# Patient Record
Sex: Female | Born: 1950 | Race: White | Hispanic: No | Smoking: Former smoker
Health system: Southern US, Community
[De-identification: ages and names within clinical notes are randomized; demographics above are authoritative.]

## PROBLEM LIST (undated history)

## (undated) DIAGNOSIS — H269 Unspecified cataract: Secondary | ICD-10-CM

## (undated) DIAGNOSIS — F329 Major depressive disorder, single episode, unspecified: Secondary | ICD-10-CM

## (undated) DIAGNOSIS — Z789 Other specified health status: Secondary | ICD-10-CM

## (undated) DIAGNOSIS — I719 Aortic aneurysm of unspecified site, without rupture: Secondary | ICD-10-CM

## (undated) DIAGNOSIS — F909 Attention-deficit hyperactivity disorder, unspecified type: Secondary | ICD-10-CM

## (undated) DIAGNOSIS — F32A Depression, unspecified: Secondary | ICD-10-CM

## (undated) DIAGNOSIS — K219 Gastro-esophageal reflux disease without esophagitis: Secondary | ICD-10-CM

## (undated) DIAGNOSIS — K589 Irritable bowel syndrome without diarrhea: Secondary | ICD-10-CM

## (undated) HISTORY — DX: Depression, unspecified: F32.A

## (undated) HISTORY — DX: Major depressive disorder, single episode, unspecified: F32.9

## (undated) HISTORY — PX: BREAST EXCISIONAL BIOPSY: SUR124

## (undated) HISTORY — DX: Attention-deficit hyperactivity disorder, unspecified type: F90.9

## (undated) HISTORY — DX: Irritable bowel syndrome, unspecified: K58.9

## (undated) HISTORY — DX: Unspecified cataract: H26.9

## (undated) HISTORY — PX: BREAST SURGERY: SHX581

## (undated) HISTORY — PX: EYE SURGERY: SHX253

---
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
## 1983-04-28 HISTORY — PX: LAPAROSCOPY: SHX197

## 1995-06-28 HISTORY — PX: KNEE ARTHROSCOPY: SUR90

## 1998-03-16 ENCOUNTER — Other Ambulatory Visit: Admission: RE | Admit: 1998-03-16 | Discharge: 1998-03-16 | Payer: Self-pay | Admitting: Obstetrics and Gynecology

## 1998-12-29 ENCOUNTER — Inpatient Hospital Stay (HOSPITAL_COMMUNITY): Admission: AD | Admit: 1998-12-29 | Discharge: 1998-12-29 | Payer: Self-pay | Admitting: Obstetrics and Gynecology

## 2000-06-17 ENCOUNTER — Other Ambulatory Visit: Admission: RE | Admit: 2000-06-17 | Discharge: 2000-06-17 | Payer: Self-pay | Admitting: *Deleted

## 2001-01-27 HISTORY — PX: BREAST LUMPECTOMY: SHX2

## 2001-12-30 ENCOUNTER — Other Ambulatory Visit: Admission: RE | Admit: 2001-12-30 | Discharge: 2001-12-30 | Payer: Self-pay | Admitting: Obstetrics & Gynecology

## 2003-03-10 ENCOUNTER — Other Ambulatory Visit: Admission: RE | Admit: 2003-03-10 | Discharge: 2003-03-10 | Payer: Self-pay | Admitting: Obstetrics & Gynecology

## 2006-03-05 ENCOUNTER — Other Ambulatory Visit: Admission: RE | Admit: 2006-03-05 | Discharge: 2006-03-05 | Payer: Self-pay | Admitting: Internal Medicine

## 2006-03-24 ENCOUNTER — Encounter: Admission: RE | Admit: 2006-03-24 | Discharge: 2006-03-24 | Payer: Self-pay | Admitting: Internal Medicine

## 2008-02-08 ENCOUNTER — Ambulatory Visit: Payer: Self-pay | Admitting: Gastroenterology

## 2008-02-22 ENCOUNTER — Ambulatory Visit: Payer: Self-pay | Admitting: Gastroenterology

## 2008-04-04 ENCOUNTER — Ambulatory Visit: Payer: Self-pay | Admitting: Gastroenterology

## 2008-05-16 ENCOUNTER — Ambulatory Visit: Payer: Self-pay | Admitting: Gastroenterology

## 2008-05-30 ENCOUNTER — Ambulatory Visit: Payer: Self-pay | Admitting: Gastroenterology

## 2008-06-13 ENCOUNTER — Ambulatory Visit: Payer: Self-pay | Admitting: Gastroenterology

## 2008-09-05 ENCOUNTER — Ambulatory Visit: Payer: Self-pay | Admitting: Gastroenterology

## 2008-12-08 ENCOUNTER — Ambulatory Visit: Payer: Self-pay | Admitting: Gastroenterology

## 2009-02-28 ENCOUNTER — Ambulatory Visit: Payer: Self-pay | Admitting: Gastroenterology

## 2009-05-29 ENCOUNTER — Ambulatory Visit: Payer: Self-pay | Admitting: Gastroenterology

## 2009-08-28 ENCOUNTER — Ambulatory Visit: Payer: Self-pay | Admitting: Gastroenterology

## 2009-12-04 ENCOUNTER — Ambulatory Visit: Payer: Self-pay | Admitting: Gastroenterology

## 2011-01-28 HISTORY — PX: BREAST LUMPECTOMY: SHX2

## 2011-12-02 ENCOUNTER — Ambulatory Visit (INDEPENDENT_AMBULATORY_CARE_PROVIDER_SITE_OTHER): Payer: No Typology Code available for payment source | Admitting: Surgery

## 2011-12-02 ENCOUNTER — Encounter (INDEPENDENT_AMBULATORY_CARE_PROVIDER_SITE_OTHER): Payer: Self-pay | Admitting: Surgery

## 2011-12-02 VITALS — BP 124/86 | HR 76 | Temp 97.9°F | Resp 16 | Ht 67.5 in | Wt 129.6 lb

## 2011-12-02 DIAGNOSIS — N63 Unspecified lump in unspecified breast: Secondary | ICD-10-CM

## 2011-12-02 NOTE — Progress Notes (Signed)
Patient ID: Deanna Velasquez, female   DOB: [birth year], 61 y.o.   MRN: [id number]  Chief Complaint  Patient presents with  . Breast Problem    new pt- eval Right breast mass; no biopsy    HPI Deanna Velasquez is a 61 y.o. female.  A recent mammogram showed an abnormality in the right breast and is is moderately suspicious and unable to be Bx with a NCB technique. Surgical consultation was requested. She has no sx. Her mother had lobular breast cancer HPI  History reviewed. No pertinent past medical history.  Past Surgical History  Procedure Date  . Knee arthroscopy 06/1995    Dr. Fredric Mare  . Pilonidal cyst excision 1969    Dr. Rolene Course  . Tonsilectomy/adenoidectomy with myringotomy 12/1953  . Laparoscopy 04/1983    Family History  Problem Relation Age of Onset  . Cancer Mother     breast  . Stroke Father   . Cancer Maternal Aunt     breast    Social History History  Substance Use Topics  . Smoking status: Former Games developer  . Smokeless tobacco: Not on file  . Alcohol Use: Yes    No Known Allergies  Current Outpatient Prescriptions  Medication Sig Dispense Refill  . buPROPion (WELLBUTRIN XL) 150 MG 24 hr tablet Take 150 mg by mouth daily.        Review of Systems Review of Systems  Constitutional: Negative for fever, chills and unexpected weight change.  HENT: Negative for hearing loss, congestion, sore throat, trouble swallowing and voice change.   Eyes: Negative for visual disturbance.  Respiratory: Negative for cough and wheezing.   Cardiovascular: Negative for chest pain, palpitations and leg swelling.  Gastrointestinal: Negative for nausea, vomiting, abdominal pain, diarrhea, constipation, blood in stool, abdominal distention and anal bleeding.  Genitourinary: Negative for hematuria, vaginal bleeding and difficulty urinating.  Musculoskeletal: Negative for arthralgias.  Skin: Negative for rash and wound.  Neurological: Negative for seizures, syncope and headaches.    Hematological: Negative for adenopathy. Does not bruise/bleed easily.  Psychiatric/Behavioral: Negative for confusion.    Blood pressure 124/86, pulse 76, temperature 97.9 F (36.6 C), temperature source Temporal, resp. rate 16, height 5' 7.5" (1.715 m), weight 129 lb 9.6 oz (58.786 kg).  Physical Exam Physical Exam  Vitals reviewed. Constitutional: She is oriented to person, place, and time. She appears well-developed and well-nourished. No distress.  HENT:  Head: Normocephalic and atraumatic.  Mouth/Throat: Oropharynx is clear and moist.  Eyes: Conjunctivae normal and EOM are normal. Pupils are equal, round, and reactive to light. No scleral icterus.  Neck: Normal range of motion. Neck supple. No tracheal deviation present. No thyromegaly present.  Cardiovascular: Normal rate, regular rhythm, normal heart sounds and intact distal pulses.  Exam reveals no gallop and no friction rub.   No murmur heard. Pulmonary/Chest: Effort normal and breath sounds normal. No respiratory distress. She has no wheezes. She has no rales.       Breasts are symmetric bilaterally. There is no palpable mass or other abnormality.  Abdominal: Soft. Bowel sounds are normal. She exhibits no distension and no mass. There is no tenderness. There is no rebound and no guarding.  Musculoskeletal: Normal range of motion. She exhibits no edema and no tenderness.  Lymphadenopathy:    She has no cervical adenopathy.    She has no axillary adenopathy.       Right: No supraclavicular adenopathy present.       Left: No supraclavicular adenopathy present.  Neurological:  She is alert and oriented to person, place, and time.  Skin: Skin is warm and dry. No rash noted. She is not diaphoretic. No erythema.  Psychiatric: She has a normal mood and affect. Her behavior is normal. Judgment and thought content normal.    Data Reviewed Mammogram and ultrasound films and reports are reviewed. There is a suspicious area in the  right breast. Is not palpable.  Assessment    Nonpalpable suspicious right breast mass    Plan    Wire localized excisional biopsy. I have discussed the indications for the lumpectomy and described the procedure. She understand that the chance of removal of the abnormal area is very good, but that occasionally we are unable to locate it and may have to do a second procedure. We also discussed the possibility of a second procedure to get additional tissue. Risks of surgery such as bleeding and infection have also been explained, as well as the implications of not doing the surgery. She understands and wishes to proceed.        Shalini Mair J 12/02/2011, 2:45 PM

## 2011-12-02 NOTE — Patient Instructions (Signed)
We will schedule surgery to remove the abnormal area in the right breast

## 2011-12-03 ENCOUNTER — Encounter (INDEPENDENT_AMBULATORY_CARE_PROVIDER_SITE_OTHER): Payer: Self-pay

## 2011-12-22 ENCOUNTER — Encounter (HOSPITAL_BASED_OUTPATIENT_CLINIC_OR_DEPARTMENT_OTHER): Payer: Self-pay | Admitting: *Deleted

## 2011-12-22 NOTE — Progress Notes (Signed)
No labs needed

## 2011-12-29 ENCOUNTER — Encounter (HOSPITAL_BASED_OUTPATIENT_CLINIC_OR_DEPARTMENT_OTHER): Payer: Self-pay

## 2011-12-29 ENCOUNTER — Encounter (HOSPITAL_BASED_OUTPATIENT_CLINIC_OR_DEPARTMENT_OTHER): Payer: Self-pay | Admitting: Certified Registered Nurse Anesthetist

## 2011-12-29 ENCOUNTER — Encounter (HOSPITAL_BASED_OUTPATIENT_CLINIC_OR_DEPARTMENT_OTHER)
Admission: RE | Disposition: A | Discharge status: Home / Self Care | Payer: Self-pay | Source: Ambulatory Visit | Attending: Surgery

## 2011-12-29 ENCOUNTER — Ambulatory Visit (HOSPITAL_BASED_OUTPATIENT_CLINIC_OR_DEPARTMENT_OTHER): Payer: No Typology Code available for payment source | Admitting: Certified Registered Nurse Anesthetist

## 2011-12-29 ENCOUNTER — Ambulatory Visit (HOSPITAL_BASED_OUTPATIENT_CLINIC_OR_DEPARTMENT_OTHER)
Admission: RE | Admit: 2011-12-29 | Discharge: 2011-12-29 | Disposition: A | Discharge status: Home / Self Care | Payer: No Typology Code available for payment source | Source: Ambulatory Visit | Attending: Surgery | Admitting: Surgery

## 2011-12-29 DIAGNOSIS — K219 Gastro-esophageal reflux disease without esophagitis: Secondary | ICD-10-CM | POA: Insufficient documentation

## 2011-12-29 DIAGNOSIS — Z87891 Personal history of nicotine dependence: Secondary | ICD-10-CM | POA: Insufficient documentation

## 2011-12-29 DIAGNOSIS — N6019 Diffuse cystic mastopathy of unspecified breast: Secondary | ICD-10-CM | POA: Insufficient documentation

## 2011-12-29 DIAGNOSIS — Z823 Family history of stroke: Secondary | ICD-10-CM | POA: Insufficient documentation

## 2011-12-29 DIAGNOSIS — Z803 Family history of malignant neoplasm of breast: Secondary | ICD-10-CM | POA: Insufficient documentation

## 2011-12-29 DIAGNOSIS — N63 Unspecified lump in unspecified breast: Secondary | ICD-10-CM

## 2011-12-29 HISTORY — DX: Gastro-esophageal reflux disease without esophagitis: K21.9

## 2011-12-29 HISTORY — DX: Other specified health status: Z78.9

## 2011-12-29 HISTORY — PX: BREAST LUMPECTOMY WITH NEEDLE LOCALIZATION: SHX5759

## 2011-12-29 LAB — POCT HEMOGLOBIN-HEMACUE: Hemoglobin: 15.2 g/dL — ABNORMAL HIGH (ref 12.0–15.0)

## 2011-12-29 SURGERY — BREAST LUMPECTOMY WITH NEEDLE LOCALIZATION
Anesthesia: Monitor Anesthesia Care | Site: Breast | Laterality: Right | Wound class: Clean

## 2011-12-29 MED ORDER — BUPIVACAINE-EPINEPHRINE 0.5% -1:200000 IJ SOLN
INTRAMUSCULAR | Status: DC | PRN
  Administered 2011-12-29: 14 mL

## 2011-12-29 MED ORDER — MIDAZOLAM HCL 2 MG/2ML IJ SOLN: 1.0000 mg | INTRAMUSCULAR | Status: DC | PRN

## 2011-12-29 MED ORDER — FENTANYL CITRATE 0.05 MG/ML IJ SOLN: 50.0000 ug | INTRAMUSCULAR | Status: DC | PRN

## 2011-12-29 MED ORDER — FENTANYL CITRATE 0.05 MG/ML IJ SOLN
INTRAMUSCULAR | Status: DC | PRN
  Administered 2011-12-29: 25 ug via INTRAVENOUS

## 2011-12-29 MED ORDER — CEFAZOLIN SODIUM-DEXTROSE 2-3 GM-% IV SOLR
2.0000 g | INTRAVENOUS | Status: AC
  Administered 2011-12-29: 2 g via INTRAVENOUS

## 2011-12-29 MED ORDER — PROPOFOL 10 MG/ML IV EMUL
INTRAVENOUS | Status: DC | PRN
  Administered 2011-12-29: 50 ug/kg/min via INTRAVENOUS

## 2011-12-29 MED ORDER — LIDOCAINE HCL (PF) 1 % IJ SOLN
INTRAMUSCULAR | Status: DC | PRN
  Administered 2011-12-29: 14 mL

## 2011-12-29 MED ORDER — LACTATED RINGERS IV SOLN
INTRAVENOUS | Status: DC
  Administered 2011-12-29: 10:00:00 via INTRAVENOUS

## 2011-12-29 MED ORDER — HYDROCODONE-ACETAMINOPHEN 5-325 MG PO TABS: 1.0000 | ORAL_TABLET | ORAL | Status: DC | PRN

## 2011-12-29 MED ORDER — CHLORHEXIDINE GLUCONATE 4 % EX LIQD: 1.0000 "application " | Freq: Once | CUTANEOUS | Status: DC

## 2011-12-29 MED ORDER — ONDANSETRON HCL 4 MG/2ML IJ SOLN
INTRAMUSCULAR | Status: DC | PRN
  Administered 2011-12-29: 4 mg via INTRAVENOUS

## 2011-12-29 SURGICAL SUPPLY — 47 items
ADH SKN CLS APL DERMABOND .7 (GAUZE/BANDAGES/DRESSINGS) ×1
APPLICATOR COTTON TIP 6IN STRL (MISCELLANEOUS) IMPLANT
BINDER BREAST LRG (GAUZE/BANDAGES/DRESSINGS) IMPLANT
BINDER BREAST MEDIUM (GAUZE/BANDAGES/DRESSINGS) IMPLANT
BINDER BREAST XLRG (GAUZE/BANDAGES/DRESSINGS) IMPLANT
BINDER BREAST XXLRG (GAUZE/BANDAGES/DRESSINGS) IMPLANT
BLADE HEX COATED 2.75 (ELECTRODE) ×2 IMPLANT
BLADE SURG 15 STRL LF DISP TIS (BLADE) ×1 IMPLANT
BLADE SURG 15 STRL SS (BLADE) ×2
CANISTER SUCTION 1200CC (MISCELLANEOUS) ×2 IMPLANT
CHLORAPREP W/TINT 26ML (MISCELLANEOUS) ×2 IMPLANT
CLIP TI MEDIUM 6 (CLIP) IMPLANT
CLIP TI WIDE RED SMALL 6 (CLIP) IMPLANT
CLOTH BEACON ORANGE TIMEOUT ST (SAFETY) ×2 IMPLANT
COVER MAYO STAND STRL (DRAPES) ×2 IMPLANT
COVER TABLE BACK 60X90 (DRAPES) ×2 IMPLANT
DECANTER SPIKE VIAL GLASS SM (MISCELLANEOUS) IMPLANT
DERMABOND ADVANCED (GAUZE/BANDAGES/DRESSINGS) ×1
DERMABOND ADVANCED .7 DNX12 (GAUZE/BANDAGES/DRESSINGS) ×1 IMPLANT
DEVICE DUBIN W/COMP PLATE 8390 (MISCELLANEOUS) IMPLANT
DRAPE LAPAROTOMY TRNSV 102X78 (DRAPE) ×2 IMPLANT
DRAPE SURG 17X23 STRL (DRAPES) IMPLANT
DRAPE UTILITY XL STRL (DRAPES) ×2 IMPLANT
ELECT REM PT RETURN 9FT ADLT (ELECTROSURGICAL) ×2
ELECTRODE REM PT RTRN 9FT ADLT (ELECTROSURGICAL) ×1 IMPLANT
GLOVE EUDERMIC 7 POWDERFREE (GLOVE) ×2 IMPLANT
GOWN PREVENTION PLUS XLARGE (GOWN DISPOSABLE) ×4 IMPLANT
KIT MARKER MARGIN INK (KITS) IMPLANT
NDL HYPO 25X1 1.5 SAFETY (NEEDLE) ×1 IMPLANT
NEEDLE HYPO 25X1 1.5 SAFETY (NEEDLE) ×2 IMPLANT
NS IRRIG 1000ML POUR BTL (IV SOLUTION) IMPLANT
PACK BASIN DAY SURGERY FS (CUSTOM PROCEDURE TRAY) ×2 IMPLANT
PENCIL BUTTON HOLSTER BLD 10FT (ELECTRODE) ×2 IMPLANT
SHEET MEDIUM DRAPE 40X70 STRL (DRAPES) ×2 IMPLANT
SLEEVE SCD COMPRESS KNEE MED (MISCELLANEOUS) ×2 IMPLANT
SPONGE GAUZE 4X4 12PLY (GAUZE/BANDAGES/DRESSINGS) IMPLANT
SPONGE INTESTINAL PEANUT (DISPOSABLE) IMPLANT
SPONGE LAP 4X18 X RAY DECT (DISPOSABLE) ×2 IMPLANT
STAPLER VISISTAT 35W (STAPLE) IMPLANT
SUT MNCRL AB 4-0 PS2 18 (SUTURE) ×2 IMPLANT
SUT SILK 0 TIES 10X30 (SUTURE) IMPLANT
SUT VICRYL 3-0 CR8 SH (SUTURE) ×2 IMPLANT
SYR CONTROL 10ML LL (SYRINGE) ×2 IMPLANT
TOWEL OR NON WOVEN STRL DISP B (DISPOSABLE) ×2 IMPLANT
TUBE CONNECTING 20X1/4 (TUBING) ×2 IMPLANT
WATER STERILE IRR 1000ML POUR (IV SOLUTION) ×2 IMPLANT
YANKAUER SUCT BULB TIP NO VENT (SUCTIONS) ×2 IMPLANT

## 2011-12-29 NOTE — Transfer of Care (Signed)
Immediate Anesthesia Transfer of Care Note  Patient: Deanna Velasquez  Procedure(s) Performed: Procedure(s) (LRB) with comments: BREAST LUMPECTOMY WITH NEEDLE LOCALIZATION (Right) - Needle Localized Removal Right Breast Mass  Patient Location: PACU  Anesthesia Type:MAC  Level of Consciousness: awake, alert , oriented and patient cooperative  Airway & Oxygen Therapy: Patient Spontanous Breathing  Post-op Assessment: Report given to PACU RN and Post -op Vital signs reviewed and stable  Post vital signs: Reviewed and stable  Complications: No apparent anesthesia complications

## 2011-12-29 NOTE — Op Note (Signed)
Deanna Velasquez  08-09-1950  [id number]  12/29/2011   Preoperative diagnosis: Right breast mass (area of architectural distortion  Postoperative diagnosis: Same  Procedure: Wire localized right breast excision of mass  Surgeon: Currie Paris, MD, FACS  Anesthesia: MAC  Clinical History and Indications: this patient presents for a guidewire localized excision of a right breast mass/ area of distortion.  Description of procedure: The patient was seen in the holding area and the plans for the procedure reviewed. The right breast was marked as the operative side. The wire localizing films were reviewed.  The patient was taken to the operating room and after satisfactory general anesthesia had been obtained the right breast was prepped and draped and the timeout was performed.  The incision was made over the presumed area of the mass. The guidewire entered high in the UOQ and tracked inferor and deep. The mass appeared to be anterior to the wire.Skin flaps were raised and using cautery the area was completely excised. I went to chest wall and took tissue anterior up to sub Q. The breast was very dense. Although the radiologist thought the specimen mammogram contained the area, I took some additional tissue to be sure I was well around the area. In doing so there was a cystic area that was encountered and removed with the extra tissue.   Bleeders were controlled with either cautery or sutures as needed.  After achieving hemostasis, the incision was closed with 3-0 Vicryl, 4-0 Monocryl subcuticular, and Dermabond.  The patient tolerated the procedure well. There were no operative complications. All counts were correct.   EBL: Minimal  Currie Paris, MD, FACS 12/29/2011 11:24 AM

## 2011-12-29 NOTE — Anesthesia Procedure Notes (Signed)
Procedure Name: MAC Date/Time: 12/29/2011 10:44 AM Performed by: Leonie Amacher D Pre-anesthesia Checklist: Patient identified, Emergency Drugs available, Suction available, Patient being monitored and Timeout performed Patient Re-evaluated:Patient Re-evaluated prior to inductionOxygen Delivery Method: Simple face mask

## 2011-12-29 NOTE — Anesthesia Preprocedure Evaluation (Signed)
Anesthesia Evaluation  Patient identified by MRN, date of birth, ID band Patient awake    Reviewed: Allergy & Precautions, H&P , NPO status , Patient's Chart, lab work & pertinent test results  History of Anesthesia Complications Negative for: history of anesthetic complications  Airway Mallampati: I TM Distance: >3 FB Neck ROM: Full    Dental No notable dental hx. (+) Caps and Dental Advisory Given   Pulmonary former smoker (quit 1980's),  breath sounds clear to auscultation  Pulmonary exam normal       Cardiovascular negative cardio ROS  Rhythm:Regular Rate:Normal     Neuro/Psych negative neurological ROS     GI/Hepatic Neg liver ROS, GERD-  Medicated and Controlled,  Endo/Other  negative endocrine ROS  Renal/GU negative Renal ROS     Musculoskeletal   Abdominal   Peds  Hematology negative hematology ROS (+)   Anesthesia Other Findings   Reproductive/Obstetrics                           Anesthesia Physical Anesthesia Plan  ASA: II  Anesthesia Plan: MAC   Post-op Pain Management:    Induction:   Airway Management Planned: Simple Face Mask and Natural Airway  Additional Equipment:   Intra-op Plan:   Post-operative Plan:   Informed Consent: I have reviewed the patients History and Physical, chart, labs and discussed the procedure including the risks, benefits and alternatives for the proposed anesthesia with the patient or authorized representative who has indicated his/her understanding and acceptance.   Dental advisory given  Plan Discussed with: CRNA and Surgeon  Anesthesia Plan Comments: (Plan routine monitors, MAC )        Anesthesia Quick Evaluation

## 2011-12-29 NOTE — Anesthesia Postprocedure Evaluation (Signed)
  Anesthesia Post-op Note  Patient: Deanna Velasquez  Procedure(s) Performed: Procedure(s) (LRB) with comments: BREAST LUMPECTOMY WITH NEEDLE LOCALIZATION (Right) - Needle Localized Removal Right Breast Mass  Patient Location: PACU  Anesthesia Type:MAC  Level of Consciousness: awake, alert , oriented and patient cooperative  Airway and Oxygen Therapy: Patient Spontanous Breathing  Post-op Pain: none  Post-op Assessment: Post-op Vital signs reviewed, Patient's Cardiovascular Status Stable, Respiratory Function Stable, Patent Airway, No signs of Nausea or vomiting and Pain level controlled  Post-op Vital Signs: Reviewed and stable  Complications: No apparent anesthesia complications

## 2011-12-29 NOTE — Interval H&P Note (Signed)
History and Physical Interval Note:  12/29/2011 10:22 AM  Deanna Velasquez  has presented today for surgery, with the diagnosis of right breast mass  The various methods of treatment have been discussed with the patient and family. After consideration of risks, benefits and other options for treatment, the patient has consented to  Procedure(s) (LRB) with comments: BREAST LUMPECTOMY WITH NEEDLE LOCALIZATION (Right) - Needle Localized Removal Right Breast Mass as a surgical intervention .  The patient's history has been reviewed, patient examined, no change in status, stable for surgery.  I have reviewed the patient's chart and labs.  Questions were answered to the patient's satisfaction.   I have reviewed the localization films and marked the right breast as the operative site Japji Kok J

## 2011-12-29 NOTE — H&P (View-Only) (Signed)
Patient ID: Deanna Velasquez, female   DOB: 11/07/1950, 61 y.o.   MRN: [id number]  Chief Complaint  Patient presents with  . Breast Problem    new pt- eval Right breast mass; no biopsy    HPI Deanna Velasquez is a 61 y.o. female.  A recent mammogram showed an abnormality in the right breast and is is moderately suspicious and unable to be Bx with a NCB technique. Surgical consultation was requested. She has no sx. Her mother had lobular breast cancer HPI  History reviewed. No pertinent past medical history.  Past Surgical History  Procedure Date  . Knee arthroscopy 06/1995    Dr. Wiener  . Pilonidal cyst excision 1969    Dr. Farley  . Tonsilectomy/adenoidectomy with myringotomy 12/1953  . Laparoscopy 04/1983    Family History  Problem Relation Age of Onset  . Cancer Mother     breast  . Stroke Father   . Cancer Maternal Aunt     breast    Social History History  Substance Use Topics  . Smoking status: Former Smoker  . Smokeless tobacco: Not on file  . Alcohol Use: Yes    No Known Allergies  Current Outpatient Prescriptions  Medication Sig Dispense Refill  . buPROPion (WELLBUTRIN XL) 150 MG 24 hr tablet Take 150 mg by mouth daily.        Review of Systems Review of Systems  Constitutional: Negative for fever, chills and unexpected weight change.  HENT: Negative for hearing loss, congestion, sore throat, trouble swallowing and voice change.   Eyes: Negative for visual disturbance.  Respiratory: Negative for cough and wheezing.   Cardiovascular: Negative for chest pain, palpitations and leg swelling.  Gastrointestinal: Negative for nausea, vomiting, abdominal pain, diarrhea, constipation, blood in stool, abdominal distention and anal bleeding.  Genitourinary: Negative for hematuria, vaginal bleeding and difficulty urinating.  Musculoskeletal: Negative for arthralgias.  Skin: Negative for rash and wound.  Neurological: Negative for seizures, syncope and headaches.    Hematological: Negative for adenopathy. Does not bruise/bleed easily.  Psychiatric/Behavioral: Negative for confusion.    Blood pressure 124/86, pulse 76, temperature 97.9 F (36.6 C), temperature source Temporal, resp. rate 16, height 5' 7.5" (1.715 m), weight 129 lb 9.6 oz (58.786 kg).  Physical Exam Physical Exam  Vitals reviewed. Constitutional: She is oriented to person, place, and time. She appears well-developed and well-nourished. No distress.  HENT:  Head: Normocephalic and atraumatic.  Mouth/Throat: Oropharynx is clear and moist.  Eyes: Conjunctivae normal and EOM are normal. Pupils are equal, round, and reactive to light. No scleral icterus.  Neck: Normal range of motion. Neck supple. No tracheal deviation present. No thyromegaly present.  Cardiovascular: Normal rate, regular rhythm, normal heart sounds and intact distal pulses.  Exam reveals no gallop and no friction rub.   No murmur heard. Pulmonary/Chest: Effort normal and breath sounds normal. No respiratory distress. She has no wheezes. She has no rales.       Breasts are symmetric bilaterally. There is no palpable mass or other abnormality.  Abdominal: Soft. Bowel sounds are normal. She exhibits no distension and no mass. There is no tenderness. There is no rebound and no guarding.  Musculoskeletal: Normal range of motion. She exhibits no edema and no tenderness.  Lymphadenopathy:    She has no cervical adenopathy.    She has no axillary adenopathy.       Right: No supraclavicular adenopathy present.       Left: No supraclavicular adenopathy present.  Neurological:   She is alert and oriented to person, place, and time.  Skin: Skin is warm and dry. No rash noted. She is not diaphoretic. No erythema.  Psychiatric: She has a normal mood and affect. Her behavior is normal. Judgment and thought content normal.    Data Reviewed Mammogram and ultrasound films and reports are reviewed. There is a suspicious area in the  right breast. Is not palpable.  Assessment    Nonpalpable suspicious right breast mass    Plan    Wire localized excisional biopsy. I have discussed the indications for the lumpectomy and described the procedure. She understand that the chance of removal of the abnormal area is very good, but that occasionally we are unable to locate it and may have to do a second procedure. We also discussed the possibility of a second procedure to get additional tissue. Risks of surgery such as bleeding and infection have also been explained, as well as the implications of not doing the surgery. She understands and wishes to proceed.        Deanna Velasquez 12/02/2011, 2:45 PM    

## 2011-12-30 ENCOUNTER — Encounter (HOSPITAL_BASED_OUTPATIENT_CLINIC_OR_DEPARTMENT_OTHER): Payer: Self-pay | Admitting: Surgery

## 2011-12-31 ENCOUNTER — Telehealth (INDEPENDENT_AMBULATORY_CARE_PROVIDER_SITE_OTHER): Payer: Self-pay | Admitting: General Surgery

## 2011-12-31 NOTE — Telephone Encounter (Signed)
Message copied by Liliana Cline on Wed Dec 31, 2011  2:07 PM ------      Message from: Currie Paris      Created: Wed Dec 31, 2011  1:56 PM       Tell her path is benign and as expected

## 2011-12-31 NOTE — Telephone Encounter (Signed)
Left message on machine for patient to call back for path results.   

## 2011-12-31 NOTE — Telephone Encounter (Signed)
Patient made aware of path results. Will follow up at appt and call with any questions prior.  

## 2012-01-13 ENCOUNTER — Encounter (INDEPENDENT_AMBULATORY_CARE_PROVIDER_SITE_OTHER): Payer: No Typology Code available for payment source | Admitting: Surgery

## 2012-01-16 ENCOUNTER — Ambulatory Visit (INDEPENDENT_AMBULATORY_CARE_PROVIDER_SITE_OTHER): Payer: No Typology Code available for payment source | Admitting: Surgery

## 2012-01-16 ENCOUNTER — Encounter (INDEPENDENT_AMBULATORY_CARE_PROVIDER_SITE_OTHER): Payer: Self-pay | Admitting: Surgery

## 2012-01-16 VITALS — BP 104/70 | HR 68 | Temp 98.0°F | Resp 16 | Ht 67.0 in | Wt 130.0 lb

## 2012-01-16 DIAGNOSIS — N63 Unspecified lump in unspecified breast: Secondary | ICD-10-CM

## 2012-01-16 NOTE — Progress Notes (Signed)
NAME: Deanna Velasquez                                            DOB: 1950/09/14 DATE: 01/16/2012                                                  MRN: [id number]  CC: Post op   HPI: This patient comes in for post op follow-up .Sheunderwent NL excisional biopsy of an area of Ca++ in the upper outer quadrant of the right breast on 12/29/11. She feels that she is doing well.  PE:  VITAL SIGNS: BP 104/70  Pulse 68  Temp 98 F (36.7 C)  Resp 16  Ht 5\' 7"  (1.702 m)  Wt 130 lb (58.968 kg)  BMI 20.36 kg/m2  General: The patient appears to be healthy, NAD Incision healing nicely, no infection or hematoma  DATA REVIEWED: Path: Diagnosis Breast, lumpectomy, Right - FIBROCYSTIC CHANGES WITH CALCIFICATIONS. - THERE IS NO EVIDENCE OF MALIGNANCY. - SEE COMMENT. Microscopic Comment The surgical resection margins of the specimen were inked and microscopically evaluated. Pecola Leisure MD Pathologist, Electronic Signature  IMPRESSION: The patient is doing well S/P excisional breast biopsy.    PLAN: RTC PRN Gave her a copy of path and reviewed it with her.

## 2012-01-16 NOTE — Patient Instructions (Signed)
Continue annual mammograms We will see you again on an as needed basis. Please call the office at [phone] if you have any questions or concerns. Thank you for allowing us to take care of you.  

## 2012-04-23 ENCOUNTER — Encounter (INDEPENDENT_AMBULATORY_CARE_PROVIDER_SITE_OTHER): Payer: Self-pay

## 2013-06-07 DIAGNOSIS — Z0000 Encounter for general adult medical examination without abnormal findings: Secondary | ICD-10-CM | POA: Diagnosis not present

## 2013-06-07 DIAGNOSIS — E2839 Other primary ovarian failure: Secondary | ICD-10-CM | POA: Diagnosis not present

## 2013-06-07 DIAGNOSIS — F411 Generalized anxiety disorder: Secondary | ICD-10-CM | POA: Diagnosis not present

## 2013-06-07 DIAGNOSIS — Z23 Encounter for immunization: Secondary | ICD-10-CM | POA: Diagnosis not present

## 2013-06-07 DIAGNOSIS — F3289 Other specified depressive episodes: Secondary | ICD-10-CM | POA: Diagnosis not present

## 2013-06-07 DIAGNOSIS — K5900 Constipation, unspecified: Secondary | ICD-10-CM | POA: Diagnosis not present

## 2013-06-07 DIAGNOSIS — F329 Major depressive disorder, single episode, unspecified: Secondary | ICD-10-CM | POA: Diagnosis not present

## 2013-06-07 DIAGNOSIS — Z1331 Encounter for screening for depression: Secondary | ICD-10-CM | POA: Diagnosis not present

## 2013-06-07 DIAGNOSIS — E785 Hyperlipidemia, unspecified: Secondary | ICD-10-CM | POA: Diagnosis not present

## 2013-06-08 DIAGNOSIS — Z780 Asymptomatic menopausal state: Secondary | ICD-10-CM | POA: Diagnosis not present

## 2013-09-30 DIAGNOSIS — Z23 Encounter for immunization: Secondary | ICD-10-CM | POA: Diagnosis not present

## 2014-01-24 DIAGNOSIS — Z1231 Encounter for screening mammogram for malignant neoplasm of breast: Secondary | ICD-10-CM | POA: Diagnosis not present

## 2014-06-13 DIAGNOSIS — K5900 Constipation, unspecified: Secondary | ICD-10-CM | POA: Diagnosis not present

## 2014-06-13 DIAGNOSIS — F329 Major depressive disorder, single episode, unspecified: Secondary | ICD-10-CM | POA: Diagnosis not present

## 2014-06-13 DIAGNOSIS — Z0000 Encounter for general adult medical examination without abnormal findings: Secondary | ICD-10-CM | POA: Diagnosis not present

## 2014-06-13 DIAGNOSIS — Z23 Encounter for immunization: Secondary | ICD-10-CM | POA: Diagnosis not present

## 2014-06-13 DIAGNOSIS — M859 Disorder of bone density and structure, unspecified: Secondary | ICD-10-CM | POA: Diagnosis not present

## 2014-06-13 DIAGNOSIS — Z1389 Encounter for screening for other disorder: Secondary | ICD-10-CM | POA: Diagnosis not present

## 2014-09-05 DIAGNOSIS — M9904 Segmental and somatic dysfunction of sacral region: Secondary | ICD-10-CM | POA: Diagnosis not present

## 2014-09-05 DIAGNOSIS — M9903 Segmental and somatic dysfunction of lumbar region: Secondary | ICD-10-CM | POA: Diagnosis not present

## 2014-09-05 DIAGNOSIS — M5441 Lumbago with sciatica, right side: Secondary | ICD-10-CM | POA: Diagnosis not present

## 2014-09-05 DIAGNOSIS — Q72811 Congenital shortening of right lower limb: Secondary | ICD-10-CM | POA: Diagnosis not present

## 2014-09-05 DIAGNOSIS — M9905 Segmental and somatic dysfunction of pelvic region: Secondary | ICD-10-CM | POA: Diagnosis not present

## 2014-09-05 DIAGNOSIS — M62830 Muscle spasm of back: Secondary | ICD-10-CM | POA: Diagnosis not present

## 2014-09-05 DIAGNOSIS — M5137 Other intervertebral disc degeneration, lumbosacral region: Secondary | ICD-10-CM | POA: Diagnosis not present

## 2014-09-27 DIAGNOSIS — B303 Acute epidemic hemorrhagic conjunctivitis (enteroviral): Secondary | ICD-10-CM | POA: Diagnosis not present

## 2014-09-27 DIAGNOSIS — H109 Unspecified conjunctivitis: Secondary | ICD-10-CM | POA: Diagnosis not present

## 2014-10-03 DIAGNOSIS — M5416 Radiculopathy, lumbar region: Secondary | ICD-10-CM | POA: Diagnosis not present

## 2014-10-03 DIAGNOSIS — M545 Low back pain: Secondary | ICD-10-CM | POA: Diagnosis not present

## 2014-10-09 DIAGNOSIS — M4727 Other spondylosis with radiculopathy, lumbosacral region: Secondary | ICD-10-CM | POA: Diagnosis not present

## 2014-10-09 DIAGNOSIS — S336XXD Sprain of sacroiliac joint, subsequent encounter: Secondary | ICD-10-CM | POA: Diagnosis not present

## 2014-10-09 DIAGNOSIS — M5417 Radiculopathy, lumbosacral region: Secondary | ICD-10-CM | POA: Diagnosis not present

## 2014-10-19 DIAGNOSIS — M5417 Radiculopathy, lumbosacral region: Secondary | ICD-10-CM | POA: Diagnosis not present

## 2014-10-19 DIAGNOSIS — M4727 Other spondylosis with radiculopathy, lumbosacral region: Secondary | ICD-10-CM | POA: Diagnosis not present

## 2014-10-19 DIAGNOSIS — S336XXD Sprain of sacroiliac joint, subsequent encounter: Secondary | ICD-10-CM | POA: Diagnosis not present

## 2014-11-02 DIAGNOSIS — S336XXD Sprain of sacroiliac joint, subsequent encounter: Secondary | ICD-10-CM | POA: Diagnosis not present

## 2014-11-02 DIAGNOSIS — M5417 Radiculopathy, lumbosacral region: Secondary | ICD-10-CM | POA: Diagnosis not present

## 2014-11-02 DIAGNOSIS — M4727 Other spondylosis with radiculopathy, lumbosacral region: Secondary | ICD-10-CM | POA: Diagnosis not present

## 2014-11-16 DIAGNOSIS — S336XXD Sprain of sacroiliac joint, subsequent encounter: Secondary | ICD-10-CM | POA: Diagnosis not present

## 2014-11-16 DIAGNOSIS — M5416 Radiculopathy, lumbar region: Secondary | ICD-10-CM | POA: Diagnosis not present

## 2014-11-16 DIAGNOSIS — M545 Low back pain: Secondary | ICD-10-CM | POA: Diagnosis not present

## 2014-11-23 DIAGNOSIS — M545 Low back pain: Secondary | ICD-10-CM | POA: Diagnosis not present

## 2014-11-23 DIAGNOSIS — R102 Pelvic and perineal pain: Secondary | ICD-10-CM | POA: Diagnosis not present

## 2014-11-28 DIAGNOSIS — Z23 Encounter for immunization: Secondary | ICD-10-CM | POA: Diagnosis not present

## 2014-11-30 DIAGNOSIS — M5416 Radiculopathy, lumbar region: Secondary | ICD-10-CM | POA: Diagnosis not present

## 2014-11-30 DIAGNOSIS — M5116 Intervertebral disc disorders with radiculopathy, lumbar region: Secondary | ICD-10-CM | POA: Diagnosis not present

## 2014-11-30 DIAGNOSIS — M545 Low back pain: Secondary | ICD-10-CM | POA: Diagnosis not present

## 2014-12-01 DIAGNOSIS — M5116 Intervertebral disc disorders with radiculopathy, lumbar region: Secondary | ICD-10-CM | POA: Diagnosis not present

## 2014-12-01 DIAGNOSIS — M545 Low back pain: Secondary | ICD-10-CM | POA: Diagnosis not present

## 2014-12-19 DIAGNOSIS — M545 Low back pain: Secondary | ICD-10-CM | POA: Diagnosis not present

## 2014-12-19 DIAGNOSIS — M5116 Intervertebral disc disorders with radiculopathy, lumbar region: Secondary | ICD-10-CM | POA: Diagnosis not present

## 2014-12-25 DIAGNOSIS — M4727 Other spondylosis with radiculopathy, lumbosacral region: Secondary | ICD-10-CM | POA: Diagnosis not present

## 2014-12-25 DIAGNOSIS — M5417 Radiculopathy, lumbosacral region: Secondary | ICD-10-CM | POA: Diagnosis not present

## 2014-12-25 DIAGNOSIS — M5126 Other intervertebral disc displacement, lumbar region: Secondary | ICD-10-CM | POA: Diagnosis not present

## 2014-12-28 DIAGNOSIS — M4727 Other spondylosis with radiculopathy, lumbosacral region: Secondary | ICD-10-CM | POA: Diagnosis not present

## 2014-12-28 DIAGNOSIS — M5417 Radiculopathy, lumbosacral region: Secondary | ICD-10-CM | POA: Diagnosis not present

## 2014-12-28 DIAGNOSIS — M5126 Other intervertebral disc displacement, lumbar region: Secondary | ICD-10-CM | POA: Diagnosis not present

## 2015-01-04 DIAGNOSIS — M5417 Radiculopathy, lumbosacral region: Secondary | ICD-10-CM | POA: Diagnosis not present

## 2015-01-04 DIAGNOSIS — M5126 Other intervertebral disc displacement, lumbar region: Secondary | ICD-10-CM | POA: Diagnosis not present

## 2015-01-04 DIAGNOSIS — M4727 Other spondylosis with radiculopathy, lumbosacral region: Secondary | ICD-10-CM | POA: Diagnosis not present

## 2015-03-08 DIAGNOSIS — Z1231 Encounter for screening mammogram for malignant neoplasm of breast: Secondary | ICD-10-CM | POA: Diagnosis not present

## 2015-03-08 DIAGNOSIS — Z803 Family history of malignant neoplasm of breast: Secondary | ICD-10-CM | POA: Diagnosis not present

## 2015-06-06 DIAGNOSIS — F4321 Adjustment disorder with depressed mood: Secondary | ICD-10-CM | POA: Diagnosis not present

## 2015-06-19 DIAGNOSIS — E7800 Pure hypercholesterolemia, unspecified: Secondary | ICD-10-CM | POA: Diagnosis not present

## 2015-06-19 DIAGNOSIS — M8580 Other specified disorders of bone density and structure, unspecified site: Secondary | ICD-10-CM | POA: Diagnosis not present

## 2015-06-19 DIAGNOSIS — Z1389 Encounter for screening for other disorder: Secondary | ICD-10-CM | POA: Diagnosis not present

## 2015-06-19 DIAGNOSIS — Z0000 Encounter for general adult medical examination without abnormal findings: Secondary | ICD-10-CM | POA: Diagnosis not present

## 2015-06-19 DIAGNOSIS — F411 Generalized anxiety disorder: Secondary | ICD-10-CM | POA: Diagnosis not present

## 2015-06-19 DIAGNOSIS — F339 Major depressive disorder, recurrent, unspecified: Secondary | ICD-10-CM | POA: Diagnosis not present

## 2015-06-21 DIAGNOSIS — F4321 Adjustment disorder with depressed mood: Secondary | ICD-10-CM | POA: Diagnosis not present

## 2015-06-26 DIAGNOSIS — M8589 Other specified disorders of bone density and structure, multiple sites: Secondary | ICD-10-CM | POA: Diagnosis not present

## 2015-06-27 DIAGNOSIS — F4321 Adjustment disorder with depressed mood: Secondary | ICD-10-CM | POA: Diagnosis not present

## 2015-07-10 DIAGNOSIS — F4321 Adjustment disorder with depressed mood: Secondary | ICD-10-CM | POA: Diagnosis not present

## 2015-07-24 DIAGNOSIS — F4321 Adjustment disorder with depressed mood: Secondary | ICD-10-CM | POA: Diagnosis not present

## 2015-08-07 DIAGNOSIS — F4321 Adjustment disorder with depressed mood: Secondary | ICD-10-CM | POA: Diagnosis not present

## 2015-08-14 ENCOUNTER — Encounter (HOSPITAL_BASED_OUTPATIENT_CLINIC_OR_DEPARTMENT_OTHER): Payer: Self-pay | Admitting: *Deleted

## 2015-08-14 ENCOUNTER — Emergency Department (HOSPITAL_BASED_OUTPATIENT_CLINIC_OR_DEPARTMENT_OTHER)
Admission: EM | Admit: 2015-08-14 | Discharge: 2015-08-14 | Disposition: A | Discharge status: Home / Self Care | Payer: Medicare Other | Attending: Emergency Medicine | Admitting: Emergency Medicine

## 2015-08-14 DIAGNOSIS — W5551XA Bitten by raccoon, initial encounter: Secondary | ICD-10-CM | POA: Insufficient documentation

## 2015-08-14 DIAGNOSIS — Y929 Unspecified place or not applicable: Secondary | ICD-10-CM | POA: Insufficient documentation

## 2015-08-14 DIAGNOSIS — T148XXA Other injury of unspecified body region, initial encounter: Secondary | ICD-10-CM

## 2015-08-14 DIAGNOSIS — S61251A Open bite of left index finger without damage to nail, initial encounter: Secondary | ICD-10-CM | POA: Diagnosis not present

## 2015-08-14 DIAGNOSIS — Z87891 Personal history of nicotine dependence: Secondary | ICD-10-CM | POA: Diagnosis not present

## 2015-08-14 DIAGNOSIS — Y999 Unspecified external cause status: Secondary | ICD-10-CM | POA: Insufficient documentation

## 2015-08-14 DIAGNOSIS — Y9389 Activity, other specified: Secondary | ICD-10-CM | POA: Insufficient documentation

## 2015-08-14 DIAGNOSIS — Z23 Encounter for immunization: Secondary | ICD-10-CM

## 2015-08-14 MED ORDER — RABIES VACCINE, PCEC IM SUSR
1.0000 mL | Freq: Once | INTRAMUSCULAR | Status: AC
  Administered 2015-08-14: 1 mL via INTRAMUSCULAR
  Filled 2015-08-14: qty 1

## 2015-08-14 MED ORDER — AMOXICILLIN-POT CLAVULANATE 875-125 MG PO TABS: 1.0000 | ORAL_TABLET | Freq: Two times a day (BID) | ORAL | Status: DC

## 2015-08-14 MED ORDER — RABIES IMMUNE GLOBULIN 150 UNIT/ML IM INJ
20.0000 [IU]/kg | INJECTION | Freq: Once | INTRAMUSCULAR | Status: DC
Start: 2015-08-14 — End: 2015-08-14
  Filled 2015-08-14: qty 10

## 2015-08-14 MED FILL — AMOX-CLAV 875-125 MG TABLET: 875-125 | 5 days supply | Qty: 10 | Fill #0

## 2015-08-14 NOTE — ED Notes (Signed)
MD at bedside. 

## 2015-08-14 NOTE — Discharge Instructions (Signed)
You were seen and evaluated today following your animal bite. He received the first vaccination of to that you need to receive. Please follow-up at the [facility] urgent care on July 21 to have the second vaccination. Please take the antibiotics prescribed to prevent infection.  Rabies Vaccine suspension for injection What is this medicine? RABIES VACCINE (ray BEES vax EEN) is used to prevent rabies infection. Rabies is mostly a disease of animals. Humans may get rabies if they are bitten by animals that have rabies. The vaccine may be given to protect someone with a high risk of rabies or it may be given to someone after they have been exposed. This medicine may be used for other purposes; ask your health care provider or pharmacist if you have questions. What should I tell my health care provider before I take this medicine? They need to know if you have any of these conditions: -bleeding disorder -cancer -HIV or AIDS -immune system problems -low blood counts, like low white cell, platelet, or red cell counts -recent or ongoing radiation therapy -take medicines that treat or prevent blood clots -an unusual or allergic reaction to vaccines, albumin, eggs, neomycin, other medicines, foods, dyes, or preservatives -pregnant or trying to get pregnant -breast-feeding How should I use this medicine? This vaccine is for injection into a muscle. It is given by a health care professional. A copy of Vaccine Information Statements will be given before each vaccination. Read this sheet carefully each time. The sheet may change frequently. Talk to your pediatrician regarding the use of this medicine in children. While this drug may be prescribed for children and infants, precautions do apply. Overdosage: If you think you have taken too much of this medicine contact a poison control center or emergency room at once. NOTE: This medicine is only for you. Do not share this medicine with others. What if I  miss a dose? Keep appointments for follow-up doses as directed. It is important not to miss your dose. Call your doctor or health care professional if you are unable to keep an appointment. All of the vaccine doses must be given in order to provide proper protection. What may interact with this medicine? -antimalarial drugs -etanercept -immune globulins -infliximab -medicines for organ transplant -medicines to treat cancer -other vaccines -some medicines for arthritis -steroid medicines like prednisone or cortisone This list may not describe all possible interactions. Give your health care provider a list of all the medicines, herbs, non-prescription drugs, or dietary supplements you use. Also tell them if you smoke, drink alcohol, or use illegal drugs. Some items may interact with your medicine. What should I watch for while using this medicine? This vaccine, like all vaccines, may not fully protect everyone. What side effects may I notice from receiving this medicine? Side effects that you should report to your doctor or health care professional as soon as possible: -allergic reactions like skin rash, itching or hives, swelling of the face, lips, or tongue -changes in vision -joint pain with fever -pain, tingling, numbness in the hands or feet -stiff neck and sensitivity to light -unusually weak or tired Side effects that usually do not require medical attention (Report these to your doctor or health care professional if they continue or are bothersome.): -dizziness -headache -muscle aches and pains -pain, redness, itching or swelling at site where injected -stomach pain -tiredness This list may not describe all possible side effects. Call your doctor for medical advice about side effects. You may report side effects  to FDA at 1-800-FDA-1088. Where should I keep my medicine? This drug is given in a hospital or clinic and will not be stored at home. NOTE: This sheet is a summary. It  may not cover all possible information. If you have questions about this medicine, talk to your doctor, pharmacist, or health care provider.    2016, [employer]vier/Gold Standard. (2009-04-02 15:31:42)

## 2015-08-14 NOTE — ED Provider Notes (Signed)
CSN: [id number]     Arrival date & time 08/14/15  1137 History   First MD Initiated Contact with Patient 08/14/15 1305     Chief Complaint  Patient presents with  . Animal Bite     (Consider location/radiation/quality/duration/timing/severity/associated sxs/prior Treatment) HPI Comments: 65 y.o. Female presents for evaluation following a raccoon bite.  Patient states that she was trying to trap an animal and a raccoon got trapped.  She said the water bowl in the trap got knocked over and she was trying to put the bowl back up right when the raccoon bit her left index finger.  Reports a history of animal bite in the past treated with Rabies vaccination.  Denies ever receiving immune globulin.     Past Medical History  Diagnosis Date  . GERD (gastroesophageal reflux disease)   . No pertinent past medical history    Past Surgical History  Procedure Laterality Date  . Knee arthroscopy  06/1995    Dr. Nicholaus Bloom  . Pilonidal cyst excision  1969    Dr. Vida Rigger  . Tonsilectomy/adenoidectomy with myringotomy  12/1953  . Laparoscopy  04/1983  . Breast lumpectomy with needle localization  12/29/2011    Procedure: BREAST LUMPECTOMY WITH NEEDLE LOCALIZATION;  Surgeon: Haywood Lasso, MD;  Location: [facility];  Service: General;  Laterality: Right;  Needle Localized Removal Right Breast Mass   Family History  Problem Relation Age of Onset  . Cancer Mother     breast  . Stroke Father   . Cancer Maternal Aunt     breast   Social History  Substance Use Topics  . Smoking status: Former Smoker    Quit date: 03/23/1980  . Smokeless tobacco: None  . Alcohol Use: Yes   OB History    No data available     Review of Systems  Constitutional: Negative for appetite change and fatigue.  HENT: Negative for congestion and nosebleeds.   Eyes: Negative for visual disturbance.  Respiratory: Negative for chest tightness, shortness of breath and wheezing.   Cardiovascular: Negative for  chest pain and palpitations.  Gastrointestinal: Negative for nausea, vomiting, abdominal pain and diarrhea.  Genitourinary: Negative for dysuria, urgency, hematuria and flank pain.  Musculoskeletal: Negative for myalgias and back pain.  Skin: Positive for wound.  Neurological: Negative for dizziness, weakness and headaches.  Hematological: Does not bruise/bleed easily.      Allergies  Review of patient's allergies indicates no known allergies.  Home Medications   Prior to Admission medications   Medication Sig Start Date End Date Taking? Authorizing Provider  amoxicillin-clavulanate (AUGMENTIN) 875-125 MG tablet Take 1 tablet by mouth every 12 (twelve) hours. 08/14/15   Harvel Quale, MD  buPROPion (WELLBUTRIN XL) 150 MG 24 hr tablet Take 150 mg by mouth daily.    Historical Provider, MD  HYDROcodone-acetaminophen (NORCO) 5-325 MG per tablet Take 1 tablet by mouth every 4 (four) hours as needed for pain. 12/29/11   Neldon Mc, MD  ranitidine (ZANTAC) 150 MG capsule Take 150 mg by mouth as needed.    Historical Provider, MD   BP 124/80 mmHg  Pulse 75  Temp(Src) 98.2 F (36.8 C) (Oral)  Resp 18  Ht 5\' 7"  (1.702 m)  Wt 137 lb (62.143 kg)  BMI 21.45 kg/m2  SpO2 99% Physical Exam  Constitutional: She is oriented to person, place, and time. She appears well-developed and well-nourished. No distress.  HENT:  Head: Normocephalic and atraumatic.  Right Ear: External ear normal.  Left Ear: External ear normal.  Nose: Nose normal.  Mouth/Throat: Oropharynx is clear and moist. No oropharyngeal exudate.  Eyes: EOM are normal. Pupils are equal, round, and reactive to light.  Neck: Normal range of motion. Neck supple.  Cardiovascular: Normal rate, regular rhythm, normal heart sounds and intact distal pulses.   No murmur heard. Pulmonary/Chest: Effort normal. No respiratory distress. She has no wheezes. She has no rales.  Abdominal: Soft. She exhibits no distension. There is no  tenderness.  Musculoskeletal: Normal range of motion. She exhibits no edema or tenderness.       Hands: Neurological: She is alert and oriented to person, place, and time.  Skin: Skin is warm and dry. No rash noted. She is not diaphoretic.  Vitals reviewed.   ED Course  Procedures (including critical care time) Labs Review Labs Reviewed - No data to display  Imaging Review No results found. I have personally reviewed and evaluated these images and lab results as part of my medical decision-making.   EKG Interpretation None      MDM  Patient was seen and evaluated in stable condition.  Patient already received previous vaccination so immune globulin not indicated as she should already have antibodies that have formed after previous vaccination.  Patient should receive two vaccinations one today (Day 0) and a second on day 3 (08/17/15).  In light of animal bite and mild erythema patient discharged with prescription for augmentin.  Strict return precautions and follow up instructions given.  Patient expressed understanding and agreement with plan of care. Final diagnoses:  Animal bite  Need for rabies vaccination    1. Animal bite , raccoon  2. Rabies vaccination    Harvel Quale, MD 08/16/15 2328

## 2015-08-14 NOTE — ED Notes (Signed)
Pt reports she has had pre-exposure vaccinations and post exposure prophylaxis twice in the past for rabies. Also that the racoon that bit her is with [facility] [employer]

## 2015-08-14 NOTE — ED Notes (Addendum)
A racoon bit her on her left 2nd digit this am. 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 Has had rabies exposure and treatment in the past.

## 2015-08-17 ENCOUNTER — Encounter (HOSPITAL_COMMUNITY): Payer: Self-pay | Admitting: *Deleted

## 2015-08-17 ENCOUNTER — Ambulatory Visit (HOSPITAL_COMMUNITY)
Admission: EM | Admit: 2015-08-17 | Discharge: 2015-08-17 | Disposition: A | Discharge status: Home / Self Care | Payer: Medicare Other | Attending: Family Medicine | Admitting: Family Medicine

## 2015-08-17 DIAGNOSIS — Z2914 Encounter for prophylactic rabies immune globin: Secondary | ICD-10-CM | POA: Diagnosis not present

## 2015-08-17 DIAGNOSIS — Z203 Contact with and (suspected) exposure to rabies: Secondary | ICD-10-CM

## 2015-08-17 MED ORDER — RABIES VACCINE, PCEC IM SUSR
1.0000 mL | Freq: Once | INTRAMUSCULAR | Status: AC
  Administered 2015-08-17: 1 mL via INTRAMUSCULAR

## 2015-08-17 MED ORDER — RABIES VACCINE, PCEC IM SUSR
INTRAMUSCULAR | Status: AC
  Filled 2015-08-17: qty 1

## 2015-08-17 NOTE — Discharge Instructions (Signed)
congrats  You  Are  Finished  Rabies Rabies is a viral infection that can be spread to people from infected animals. The infection affects the brain and central nervous system. Once the disease develops, it almost always causes death. Because of this, when a person is bitten by an animal that may have rabies, treatment to prevent rabies often needs to be started whether or not the animal is known to be infected. Prompt treatment with the rabies vaccine and rabies immune globulin is very effective at preventing the infection from developing in people who have been exposed to the rabies virus. CAUSES  Rabies is caused by a virus that lives inside some animals. When a person is bitten by an infected animal, the rabies virus is spread to the person through the infected spit (saliva) of the animal. This virus can be carried by animals such as dogs, cats, skunks, bats, woodchucks, raccoons, coyotes, and foxes. SYMPTOMS  By the time symptoms appear, rabies is usually fatal for the person. Common symptoms include:  Headache.  Fever.  Fatigue and weakness.  Agitation.  Anxiety.  Confusion.  Unusual behavior, such as hyperactivity, fear of water (hydrophobia), or fear of air (aerophobia).  Hallucinations.  Insomnia.  Weakness in the arms or legs.  Difficulty swallowing. Most people get sick in 1-3 months after being bitten. This often varies and may depend on the location of the bite. The infection will take less time to develop if the bite occurred closer to the head.  DIAGNOSIS  To determine if a person is infected, several tests must be performed, such as:  A skin biopsy.  A saliva test.  A lumbar puncture to remove spinal fluid so it can be examined.  Blood tests. TREATMENT  Treatment to prevent the infection from developing (post-exposure prophylaxis, PEP) is often started before knowing for sure if the person has been exposed to the rabies virus. PEP involves cleaning the wound,  giving an antibody injection (rabies immune globulin), and giving a series of rabies vaccine injections. The series of injections are usually given over a two-week period. If possible, the animal that bit the person will be observed to see if it remains healthy. If the animal has been killed, it can be sent to a state laboratory and examined to see if the animal had rabies. If a person is bitten by a domestic animal (dog, cat, or ferret) that appears healthy and can be observed to see if it remains healthy, often no further treatment is necessary other than care of the wounds caused by the animal. Rabies is often a fatal illness once the infection develops in a person. Although a few people who developed rabies have survived after experimental treatment with certain drugs, all these survivors still had severe nervous system problems after the treatment. This is why caregivers use extra caution and begin PEP treatment for people who have been bitten by animals that are possibly infected with rabies.  HOME CARE INSTRUCTIONS  If you were bitten by an unknown animal, make sure you know your caregiver's instructions for follow-up. If the animal was sent to a laboratory for examination, ask when the test results will be ready. Make sure you get the test results.  Take these steps to care for your wound:  Keep the wound clean, dry, and dressed as directed by your caregiver.  Keep the injured part elevated as much as possible.  Do not resume use of the affected area until directed.  Only take  over-the-counter or prescription medicines as directed by your caregiver.  Keep all follow-up appointments as directed by your caregiver. PREVENTION  To prevent rabies, people need to reduce their risk of having contact with infected animals.   Make sure your pets (dogs, cats, ferrets) are vaccinated against rabies. Keep these vaccinations up-to-date as directed by your veterinarian.  Supervise your pets when they  are outside. Keep them away from wild animals.  Call your local animal control services to report any stray animals. These animals may not be vaccinated.  Stay away from stray or wild animals.  Consider getting the rabies vaccine (preexposure) if you are traveling to an area where rabies is common or if your job or activities involve possible contact with wild or stray animals. Discuss this with your caregiver.   This information is not intended to replace advice given to you by your health care provider. Make sure you discuss any questions you have with your health care provider.   Document Released: 01/13/2005 Document Revised: 02/03/2014 Document Reviewed: 08/12/2011 Elsevier Interactive Patient Education [employer].

## 2015-08-17 NOTE — ED Notes (Addendum)
Pt    Is here      For  2nd  Rabies     Vaccination         1   St  One    In right               Arm    Verbalizes  No  Complaints

## 2015-08-21 DIAGNOSIS — F4321 Adjustment disorder with depressed mood: Secondary | ICD-10-CM | POA: Diagnosis not present

## 2015-10-16 DIAGNOSIS — Z23 Encounter for immunization: Secondary | ICD-10-CM | POA: Diagnosis not present

## 2015-11-08 DIAGNOSIS — F4321 Adjustment disorder with depressed mood: Secondary | ICD-10-CM | POA: Diagnosis not present

## 2015-11-15 DIAGNOSIS — F4321 Adjustment disorder with depressed mood: Secondary | ICD-10-CM | POA: Diagnosis not present

## 2015-11-22 DIAGNOSIS — F4321 Adjustment disorder with depressed mood: Secondary | ICD-10-CM | POA: Diagnosis not present

## 2015-12-05 DIAGNOSIS — F4321 Adjustment disorder with depressed mood: Secondary | ICD-10-CM | POA: Diagnosis not present

## 2015-12-12 DIAGNOSIS — F4321 Adjustment disorder with depressed mood: Secondary | ICD-10-CM | POA: Diagnosis not present

## 2015-12-19 DIAGNOSIS — F4321 Adjustment disorder with depressed mood: Secondary | ICD-10-CM | POA: Diagnosis not present

## 2015-12-26 DIAGNOSIS — F4321 Adjustment disorder with depressed mood: Secondary | ICD-10-CM | POA: Diagnosis not present

## 2016-01-02 DIAGNOSIS — F4321 Adjustment disorder with depressed mood: Secondary | ICD-10-CM | POA: Diagnosis not present

## 2016-01-09 DIAGNOSIS — F4321 Adjustment disorder with depressed mood: Secondary | ICD-10-CM | POA: Diagnosis not present

## 2016-03-25 DIAGNOSIS — Z803 Family history of malignant neoplasm of breast: Secondary | ICD-10-CM | POA: Diagnosis not present

## 2016-03-25 DIAGNOSIS — Z1231 Encounter for screening mammogram for malignant neoplasm of breast: Secondary | ICD-10-CM | POA: Diagnosis not present

## 2016-07-08 DIAGNOSIS — F324 Major depressive disorder, single episode, in partial remission: Secondary | ICD-10-CM | POA: Diagnosis not present

## 2016-07-08 DIAGNOSIS — F329 Major depressive disorder, single episode, unspecified: Secondary | ICD-10-CM | POA: Diagnosis not present

## 2016-07-08 DIAGNOSIS — Z1389 Encounter for screening for other disorder: Secondary | ICD-10-CM | POA: Diagnosis not present

## 2016-07-08 DIAGNOSIS — Z0000 Encounter for general adult medical examination without abnormal findings: Secondary | ICD-10-CM | POA: Diagnosis not present

## 2016-07-08 DIAGNOSIS — E7800 Pure hypercholesterolemia, unspecified: Secondary | ICD-10-CM | POA: Diagnosis not present

## 2016-10-10 DIAGNOSIS — Z23 Encounter for immunization: Secondary | ICD-10-CM | POA: Diagnosis not present

## 2017-03-03 ENCOUNTER — Other Ambulatory Visit: Payer: Self-pay | Admitting: Internal Medicine

## 2017-03-03 ENCOUNTER — Ambulatory Visit
Admission: RE | Admit: 2017-03-03 | Discharge: 2017-03-03 | Disposition: A | Discharge status: Home / Self Care | Payer: Medicare Other | Source: Ambulatory Visit | Attending: Internal Medicine | Admitting: Internal Medicine

## 2017-03-03 DIAGNOSIS — R0781 Pleurodynia: Secondary | ICD-10-CM | POA: Diagnosis not present

## 2017-03-03 DIAGNOSIS — R0789 Other chest pain: Secondary | ICD-10-CM

## 2017-03-06 ENCOUNTER — Other Ambulatory Visit: Payer: Self-pay | Admitting: Internal Medicine

## 2017-03-06 DIAGNOSIS — Z1231 Encounter for screening mammogram for malignant neoplasm of breast: Secondary | ICD-10-CM

## 2017-03-30 ENCOUNTER — Ambulatory Visit
Admission: RE | Admit: 2017-03-30 | Discharge: 2017-03-30 | Disposition: A | Discharge status: Home / Self Care | Payer: Medicare Other | Source: Ambulatory Visit | Attending: Internal Medicine | Admitting: Internal Medicine

## 2017-03-30 DIAGNOSIS — Z1231 Encounter for screening mammogram for malignant neoplasm of breast: Secondary | ICD-10-CM

## 2017-07-10 DIAGNOSIS — E7800 Pure hypercholesterolemia, unspecified: Secondary | ICD-10-CM | POA: Diagnosis not present

## 2017-07-10 DIAGNOSIS — Z1389 Encounter for screening for other disorder: Secondary | ICD-10-CM | POA: Diagnosis not present

## 2017-07-10 DIAGNOSIS — Z1211 Encounter for screening for malignant neoplasm of colon: Secondary | ICD-10-CM | POA: Diagnosis not present

## 2017-07-10 DIAGNOSIS — Z0000 Encounter for general adult medical examination without abnormal findings: Secondary | ICD-10-CM | POA: Diagnosis not present

## 2017-07-10 DIAGNOSIS — F329 Major depressive disorder, single episode, unspecified: Secondary | ICD-10-CM | POA: Diagnosis not present

## 2017-07-10 DIAGNOSIS — F324 Major depressive disorder, single episode, in partial remission: Secondary | ICD-10-CM | POA: Diagnosis not present

## 2017-07-10 DIAGNOSIS — M85851 Other specified disorders of bone density and structure, right thigh: Secondary | ICD-10-CM | POA: Diagnosis not present

## 2017-07-28 DIAGNOSIS — M8588 Other specified disorders of bone density and structure, other site: Secondary | ICD-10-CM | POA: Diagnosis not present

## 2017-08-28 DIAGNOSIS — Z1211 Encounter for screening for malignant neoplasm of colon: Secondary | ICD-10-CM | POA: Diagnosis not present

## 2017-09-30 DIAGNOSIS — H2513 Age-related nuclear cataract, bilateral: Secondary | ICD-10-CM | POA: Diagnosis not present

## 2017-09-30 DIAGNOSIS — H35033 Hypertensive retinopathy, bilateral: Secondary | ICD-10-CM | POA: Diagnosis not present

## 2017-09-30 DIAGNOSIS — H25013 Cortical age-related cataract, bilateral: Secondary | ICD-10-CM | POA: Diagnosis not present

## 2017-09-30 DIAGNOSIS — H35363 Drusen (degenerative) of macula, bilateral: Secondary | ICD-10-CM | POA: Diagnosis not present

## 2017-10-16 DIAGNOSIS — L570 Actinic keratosis: Secondary | ICD-10-CM | POA: Diagnosis not present

## 2017-10-16 DIAGNOSIS — D225 Melanocytic nevi of trunk: Secondary | ICD-10-CM | POA: Diagnosis not present

## 2017-10-20 DIAGNOSIS — Z23 Encounter for immunization: Secondary | ICD-10-CM | POA: Diagnosis not present

## 2018-01-29 DIAGNOSIS — R5383 Other fatigue: Secondary | ICD-10-CM | POA: Diagnosis not present

## 2018-01-29 DIAGNOSIS — R0600 Dyspnea, unspecified: Secondary | ICD-10-CM | POA: Diagnosis not present

## 2018-02-23 DIAGNOSIS — J069 Acute upper respiratory infection, unspecified: Secondary | ICD-10-CM | POA: Diagnosis not present

## 2018-03-02 ENCOUNTER — Other Ambulatory Visit: Payer: Self-pay | Admitting: Internal Medicine

## 2018-03-02 DIAGNOSIS — Z1231 Encounter for screening mammogram for malignant neoplasm of breast: Secondary | ICD-10-CM

## 2018-03-11 ENCOUNTER — Ambulatory Visit: Payer: Medicare Other | Admitting: Internal Medicine

## 2018-03-16 DIAGNOSIS — J329 Chronic sinusitis, unspecified: Secondary | ICD-10-CM | POA: Diagnosis not present

## 2018-03-19 ENCOUNTER — Encounter: Payer: Self-pay | Admitting: Internal Medicine

## 2018-03-24 ENCOUNTER — Ambulatory Visit (INDEPENDENT_AMBULATORY_CARE_PROVIDER_SITE_OTHER): Payer: Medicare Other | Admitting: Internal Medicine

## 2018-03-24 ENCOUNTER — Encounter (INDEPENDENT_AMBULATORY_CARE_PROVIDER_SITE_OTHER): Payer: Self-pay

## 2018-03-24 ENCOUNTER — Encounter: Payer: Self-pay | Admitting: Internal Medicine

## 2018-03-24 VITALS — BP 116/68 | HR 71 | Ht 67.0 in | Wt 141.0 lb

## 2018-03-24 DIAGNOSIS — Z87891 Personal history of nicotine dependence: Secondary | ICD-10-CM

## 2018-03-24 DIAGNOSIS — R0600 Dyspnea, unspecified: Secondary | ICD-10-CM | POA: Diagnosis not present

## 2018-03-24 NOTE — Progress Notes (Signed)
Cardiology Office Note:    Date:  03/24/2018   ID:  Deanna Velasquez, DOB [birth year], MRN [id number]  PCP:  Seward Carol, MD  Cardiologist:  Elouise Munroe, MD  Electrophysiologist:  None   Referring MD: Seward Carol, MD   Dyspnea on exertion  History of Present Illness:    Deanna Velasquez is a 68 y.o. female with a hx of depression, IBS, and cataracts who presents for concerns of dyspnea on exertion.   She overall enjoys good health, but notes concerns of shortness of breath. She notices when she walks up her driveway she will be dyspneic out of proportion to her workload. She will recover quickly. She denies concomitant chest pain or palpitations. She does note rare palpitations, not associated with activity. She denies shortness of breath at rest. She is generally able to exercise without shortness of breath.   She denies syncope, presyncope, dizziness, lightheadedness, PND, orthopnea, or leg swelling. She does note varicose veins and we discussed conservative measures to address any edema that may result.   She is a former smoker, 2-3 ppd for 16 years and quit in 1982. She drinks 2-3 glasses of wine per week.   Family history significant for mother who was a smoker and had a ruptured aortic aneurysm. Her sister passed from CHF, and was a diabetic and smoker.   Past Medical History:  Diagnosis Date  . ADHD   . Cataracts, bilateral   . Depression   . GERD (gastroesophageal reflux disease)   . IBS (irritable bowel syndrome)   . No pertinent past medical history     Past Surgical History:  Procedure Laterality Date  . BREAST EXCISIONAL BIOPSY Right    benign  . BREAST LUMPECTOMY WITH NEEDLE LOCALIZATION  12/29/2011   Procedure: BREAST LUMPECTOMY WITH NEEDLE LOCALIZATION;  Surgeon: Haywood Lasso, MD;  Location: [facility];  Service: General;  Laterality: Right;  Needle Localized Removal Right Breast Mass  . KNEE ARTHROSCOPY  06/1995   Dr. Nicholaus Bloom  .  LAPAROSCOPY  04/1983  . PILONIDAL CYST EXCISION  1969   Dr. Vida Rigger  . TONSILECTOMY/ADENOIDECTOMY WITH MYRINGOTOMY  12/1953    Current Medications: No outpatient medications have been marked as taking for the 03/24/18 encounter (Office Visit) with Elouise Munroe, MD.     Allergies:   Paxil [paroxetine hcl]; Prozac [fluoxetine hcl]; Sertraline hcl; Tape; and Wellbutrin [bupropion]   Social History   Socioeconomic History  . Marital status: Divorced    Spouse name: Not on file  . Number of children: Not on file  . Years of education: Not on file  . Highest education level: Not on file  Occupational History  . Not on file  Social Needs  . Financial resource strain: Not on file  . Food insecurity:    Worry: Not on file    Inability: Not on file  . Transportation needs:    Medical: Not on file    Non-medical: Not on file  Tobacco Use  . Smoking status: Former Smoker    Last attempt to quit: 03/23/1980    Years since quitting: 38.1  . Smokeless tobacco: Never Used  Substance and Sexual Activity  . Alcohol use: Yes  . Drug use: No  . Sexual activity: Not on file  Lifestyle  . Physical activity:    Days per week: Not on file    Minutes per session: Not on file  . Stress: Not on file  Relationships  . Social connections:  Talks on phone: Not on file    Gets together: Not on file    Attends religious service: Not on file    Active member of club or organization: Not on file    Attends meetings of clubs or organizations: Not on file    Relationship status: Not on file  Other Topics Concern  . Not on file  Social History Narrative  . Not on file     Family History: The patient's family history includes Cancer in her maternal aunt and mother; Congestive Heart Failure in her sister and sister; Diabetes in her sister; Stroke in her father; Tuberculosis in her maternal grandfather.  ROS:   Please see the history of present illness.    All other systems reviewed and  are negative.  EKGs/Labs/Other Studies Reviewed:    The following studies were reviewed today:  EKG:  NSR, 71 bpm  Recent Labs: No results found for requested labs within last 8760 hours.  Recent Lipid Panel No results found for: CHOL, TRIG, HDL, CHOLHDL, VLDL, LDLCALC, LDLDIRECT  Physical Exam:    VS:  BP 116/68 (BP Location: Right Arm, Patient Position: Sitting, Cuff Size: Normal)   Pulse 71   Ht 5\' 7"  (1.702 m)   Wt 141 lb (64 kg)   LMP  (LMP Unknown)   BMI 22.08 kg/m     Wt Readings from Last 3 Encounters:  03/24/18 141 lb (64 kg)  08/14/15 137 lb (62.1 kg)  01/16/12 130 lb (59 kg)     Constitutional: No acute distress Eyes: pupils equally round and reactive to light, sclera non-icteric, normal conjunctiva and lids ENMT: normal dentition, moist mucous membranes Cardiovascular: regular rhythm, normal rate, no murmurs. S1 and S2 normal. Radial pulses normal bilaterally. No jugular venous distention.  Respiratory: clear to auscultation bilaterally GI : normal bowel sounds, soft and nontender. No distention.   MSK: extremities warm, well perfused. No edema.  NEURO: grossly nonfocal exam, moves all extremities. PSYCH: alert and oriented x 3, normal mood and affect.   ASSESSMENT:    1. Dyspnea, unspecified type   2. Former smoker    PLAN:    To ensure we are not missing ischemia with a history of smoking and exertional dyspnea, the patient and I have participated in shared decision making and determined that we will perform an exercise treadmill test to assess for ischemia, and an echocardiogram to evaluate diastolic function and rule out any structural issues.   Follow up in 3 months or as needed based on symptoms.  Exercise recommendations: Goal of exercising for at least 30 minutes a day, at least 5 times per week.  Please exercise to a moderate exertion.  This means that while exercising it is difficult to speak in full sentences, however you are not so short of  breath that you feel you must stop, and not so comfortable that you can carry on a full conversation.  Exertion level should be approximately a 5/10, if 10 is the most exertion you can perform.  Diet recommendations: Recommend a heart healthy diet such as the Mediterranean diet.  This diet consists of plant based foods, healthy fats, lean meats, olive oil.  It suggests limiting the intake of simple carbohydrates such as white breads, pastries, and pastas.  It also limits the amount of red meat, wine, and dairy products such as cheese that one should consume on a daily basis.    TIME SPENT WITH PATIENT: 40 minutes of direct patient care. More  than 50% of that time was spent on coordination of care and counseling regarding diet and lifestyle to optimized CV health, testing for DOE, and possible sequelae of testing.  Cherlynn Kaiser, MD [place]  [facility]     Medication Adjustments/Labs and Tests Ordered: Current medicines are reviewed at length with the patient today.  Concerns regarding medicines are outlined above.  Orders Placed This Encounter  Procedures  . Exercise Tolerance Test  . EKG 12-Lead  . ECHOCARDIOGRAM COMPLETE   No orders of the defined types were placed in this encounter.   Patient Instructions  Medication Instructions:  Dr.Haneef Hallquist recommends that you continue on your current medications as directed. Please refer to the Current Medication list given to you today.  If you need a refill on your cardiac medications before your next appointment, please call your pharmacy.   Lab work: None ordered  Testing/Procedures: Dr.Mariann Palo has requested that you have an echocardiogram. Echocardiography is a painless test that uses sound waves to create images of your heart. It provides your doctor with information about the size and shape of your heart and how well your heart's chambers and valves are working. This procedure takes approximately one hour. There are no  restrictions for this procedure.  Dr.Jonice Cerra has requested that you have an exercise tolerance test. For further information please visit HugeFiesta.[email or web address]. Please also follow instruction sheet, as given.       Follow-Up: At [facility], you and your health needs are our priority.  As part of our continuing mission to provide you with exceptional heart care, we have created designated Provider Care Teams.  These Care Teams include your primary Cardiologist (physician) and Advanced Practice Providers (APPs -  Physician Assistants and Nurse Practitioners) who all work together to provide you with the care you need, when you need it. You will need a follow up appointment in 3 months.     You may see  Dr.Tryphena Perkovich or one of the following Advanced Practice Providers on your designated Care Team:   Rosaria Ferries, PA-C . Jory Sims, DNP, ANP      Signed, Elouise Munroe, MD  03/24/2018 12:59 PM    [place]

## 2018-03-24 NOTE — Patient Instructions (Signed)
Medication Instructions:  Dr.Acharya recommends that you continue on your current medications as directed. Please refer to the Current Medication list given to you today.  If you need a refill on your cardiac medications before your next appointment, please call your pharmacy.   Lab work: None ordered  Testing/Procedures: Dr.Acharya has requested that you have an echocardiogram. Echocardiography is a painless test that uses sound waves to create images of your heart. It provides your doctor with information about the size and shape of your heart and how well your heart's chambers and valves are working. This procedure takes approximately one hour. There are no restrictions for this procedure.  Dr.Acharya has requested that you have an exercise tolerance test. For further information please visit [email or web address]. Please also follow instruction sheet, as given.       Follow-Up: At [facility], you and your health needs are our priority.  As part of our continuing mission to provide you with exceptional heart care, we have created designated Provider Care Teams.  These Care Teams include your primary Cardiologist (physician) and Advanced Practice Providers (APPs -  Physician Assistants and Nurse Practitioners) who all work together to provide you with the care you need, when you need it. You will need a follow up appointment in 3 months.     You may see  Dr.Acharya or one of the following Advanced Practice Providers on your designated Care Team:   Rosaria Ferries, PA-C . Jory Sims, DNP, ANP

## 2018-03-30 ENCOUNTER — Telehealth (HOSPITAL_COMMUNITY): Payer: Self-pay

## 2018-03-30 NOTE — Telephone Encounter (Signed)
Encounter complete. 

## 2018-04-01 ENCOUNTER — Ambulatory Visit (HOSPITAL_COMMUNITY)
Admission: RE | Admit: 2018-04-01 | Discharge: 2018-04-01 | Disposition: A | Discharge status: Home / Self Care | Payer: Medicare Other | Source: Ambulatory Visit | Attending: Internal Medicine | Admitting: Internal Medicine

## 2018-04-01 DIAGNOSIS — R0600 Dyspnea, unspecified: Secondary | ICD-10-CM | POA: Diagnosis not present

## 2018-04-01 LAB — EXERCISE TOLERANCE TEST
Estimated workload: 10.9 METS
Exercise duration (min): 9 min
Exercise duration (sec): 33 s
MPHR: 150 {beats}/min
Peak HR: 151 {beats}/min
Percent HR: 100 %
RPE: 19
Rest HR: 74 {beats}/min

## 2018-04-02 ENCOUNTER — Telehealth: Payer: Self-pay

## 2018-04-02 NOTE — Telephone Encounter (Signed)
-----   Message from Elouise Munroe, MD sent at 04/01/2018  3:20 PM EST ----- Low risk stress test

## 2018-04-02 NOTE — Telephone Encounter (Signed)
Called to give pt stress test results. Unable to lmom. Patient's phone rings out.

## 2018-04-05 ENCOUNTER — Ambulatory Visit
Admission: RE | Admit: 2018-04-05 | Discharge: 2018-04-05 | Disposition: A | Discharge status: Home / Self Care | Payer: Medicare Other | Source: Ambulatory Visit | Attending: Internal Medicine | Admitting: Internal Medicine

## 2018-04-05 DIAGNOSIS — Z1231 Encounter for screening mammogram for malignant neoplasm of breast: Secondary | ICD-10-CM | POA: Diagnosis not present

## 2018-04-06 ENCOUNTER — Ambulatory Visit (HOSPITAL_COMMUNITY): Payer: Medicare Other | Attending: Cardiovascular Disease

## 2018-04-06 DIAGNOSIS — R0600 Dyspnea, unspecified: Secondary | ICD-10-CM | POA: Diagnosis not present

## 2018-05-11 ENCOUNTER — Telehealth: Payer: Self-pay | Admitting: *Deleted

## 2018-05-11 DIAGNOSIS — R0600 Dyspnea, unspecified: Secondary | ICD-10-CM

## 2018-05-11 DIAGNOSIS — I351 Nonrheumatic aortic (valve) insufficiency: Secondary | ICD-10-CM

## 2018-05-11 NOTE — Telephone Encounter (Signed)
LEFT MESSAGE  ECHO RESULTS RELEASE TO MYCHART , ANY QUESTION MAY CALL BACK   ORDER PLACED  ECHO RECHECK IN 1 YEAR

## 2018-06-15 ENCOUNTER — Telehealth: Payer: Self-pay | Admitting: *Deleted

## 2018-06-15 NOTE — Telephone Encounter (Signed)
Left message to call back - discuss virtual visit 5/26 with Dr Margaretann Loveless , sent  Instruction/consent via [facility]

## 2018-06-15 NOTE — Telephone Encounter (Signed)
   TELEPHONE CALL NOTE  This patient has been deemed a candidate for follow-up tele-health visit to limit community exposure during the Covid-19 pandemic. I spoke with the patient via phone to discuss instructions.  The patient was advised to review the section on consent for treatment as well. The patient will receive a phone call 2-3 days prior to their E-Visit at which time consent will be verbally confirmed.   A Virtual Office Visit appointment type has been scheduled for 5/26 with DR Margaretann Loveless, with "VIDEO/text type.  I have confirmed the patient is active in [facility].   Raiford Simmonds, RN 06/15/2018 11:10 AM

## 2018-06-18 ENCOUNTER — Telehealth: Payer: Self-pay | Admitting: Internal Medicine

## 2018-06-18 NOTE — Telephone Encounter (Signed)
Smartphone/video pre-reg complete, mychart active, verbals consent given CALL PT CELL [phone] 06/18/2018 MS

## 2018-06-22 ENCOUNTER — Telehealth (INDEPENDENT_AMBULATORY_CARE_PROVIDER_SITE_OTHER): Payer: Medicare Other | Admitting: Internal Medicine

## 2018-06-22 ENCOUNTER — Encounter: Payer: Self-pay | Admitting: Internal Medicine

## 2018-06-22 VITALS — BP 97/61 | HR 58 | Ht 67.0 in | Wt 136.0 lb

## 2018-06-22 DIAGNOSIS — R0600 Dyspnea, unspecified: Secondary | ICD-10-CM

## 2018-06-22 DIAGNOSIS — Z87891 Personal history of nicotine dependence: Secondary | ICD-10-CM

## 2018-06-22 DIAGNOSIS — I351 Nonrheumatic aortic (valve) insufficiency: Secondary | ICD-10-CM

## 2018-06-22 NOTE — Patient Instructions (Addendum)
Medication Instructions:  NO CHANGE If you need a refill on your cardiac medications before your next appointment, please call your pharmacy.   Lab work: If you have labs (blood work) drawn today and your tests are completely normal, you will receive your results only by: [name] Kitchen MyChart Message (if you have MyChart) OR . A paper copy in the mail If you have any lab test that is abnormal or we need to change your treatment, we will call you to review the results.   Follow-Up: At [facility], you and your health needs are our priority.  As part of our continuing mission to provide you with exceptional heart care, we have created designated Provider Care Teams.  These Care Teams include your primary Cardiologist (physician) and Advanced Practice Providers (APPs -  Physician Assistants and Nurse Practitioners) who all work together to provide you with the care you need, when you need it. You will need a follow up appointment in 7 months.  Please call our office 2 months in advance to schedule this appointment.  You may see Elouise Munroe, MD or one of the following Advanced Practice Providers on your designated Care Team:   Rosaria Ferries, PA-C . Jory Sims, DNP, ANP

## 2018-06-22 NOTE — Progress Notes (Signed)
Virtual Visit via Video Note   This visit type was conducted due to national recommendations for restrictions regarding the COVID-19 Pandemic (e.g. social distancing) in an effort to limit this patient's exposure and mitigate transmission in our community.  Due to her co-morbid illnesses, this patient is at least at moderate risk for complications without adequate follow up.  This format is felt to be most appropriate for this patient at this time.  All issues noted in this document were discussed and addressed.  A limited physical exam was performed with this format.  Please refer to the patient's chart for her consent to telehealth for [facility].   Date:  06/22/2018   ID:  Deanna Velasquez, DOB 15-Apr-1950, MRN [id number]  Patient Location: Home Provider Location: Home  PCP:  Seward Carol, MD  Cardiologist:  Elouise Munroe, MD  Electrophysiologist:  None   Evaluation Performed:  Follow-Up Visit  Chief Complaint:  Family history of AAA with rupture, DOE.   History of Present Illness:    Deanna Velasquez is a 68 y.o. female with a hx of depression, IBS, and cataracts who presents for concerns of dyspnea on exertion.   From her last evaluation, we performed an exercise tolerance test, and an echocardiogram.  Deanna Velasquez and I have reviewed these results line by line today and I have answered her questions to the best of my ability.  Her ETT showed good exercise tolerance with an exercise time of 9 minutes 33 seconds, no chest pain no shortness of breath and no ST-T wave changes.  Normal blood pressure with exercise.  Her echocardiogram demonstrated a normal left ventricular systolic function with an ejection fraction of 60 to 65%.  I have explained to the patient that this is within the normal range.  We discussed the natural history and implications of left ventricular diastolic function and the meaning of impaired relaxation.  We briefly discussed E over E prime as a surrogate for  filling pressures and that this falls in the indeterminate range based on most recent diastolic guidelines.  We discussed that the right ventricle appears normal.  I reviewed with the patient the natural history of aortic regurgitation.  I described that aortic rotation is largely well tolerated over time, and left ventricular parameters remain normal.  This will be observed over time or if symptoms change.  Patient has a family history of her mother having a ruptured abdominal aortic aneurysm and was a smoker.  She is keenly interested to keep track of her aorta.  She has had abdominal aorta screening before which has been normal with her primary care providers.  I have explained that on our echocardiography study we do not routinely measure or report the abdominal aorta and she should continue to follow this with her primary care doctor.  We do however measure the ascending aorta when visualized.  Her ascending aorta measures 37 mm and was documented as dilated in her echo report. She is very worried about this. I described for the patient that if we index her aorta size to her body surface area, and compare to those her age, as described in the predictive model in [place] Sept 2018, her measured mid ascending aorta likely falls in a normal range, with the upper limit of normal calculated to 37.9 mm. Since she is at 37 mm, and this is within a normal range, it may not represent aneurysmal dilation but requires surveillance over time given her smoking  history and family history. I described in detail based on ACC/AHA guidelines for thoracic aorta management, the different conditions and thresholds for intervention on dilatation of the ascending aorta, as well as concerning growth patterns that would prompt intervention.   We discussed her dyspnea which has improved,And she requests recommendations for diet and exercise so as to keep herself as healthy as possible.  We discussed that a low dose  of diuretic can sometimes be used if diastolic dysfunction is felt to be the cause of shortness of breath.  Due to her low at baseline blood pressures, we will not plan to institute any therapy that may have a blood pressure lowering effect.  With reassuring cardiovascular testing I have encouraged the patient to discuss any fatigue and shortness of breath with her primary care doctor for noncardiac causes.  The patient denies chest pain, chest pressure, dyspnea at rest, palpitations, PND, orthopnea, or leg swelling. Denies syncope or presyncope. Denies dizziness or lightheadedness.  The patient does not have symptoms concerning for COVID-19 infection (fever, chills, cough, or new shortness of breath).    Past Medical History:  Diagnosis Date   ADHD    Cataracts, bilateral    Depression    GERD (gastroesophageal reflux disease)    IBS (irritable bowel syndrome)    No pertinent past medical history    Past Surgical History:  Procedure Laterality Date   BREAST EXCISIONAL BIOPSY Right    benign   BREAST LUMPECTOMY WITH NEEDLE LOCALIZATION  12/29/2011   Procedure: BREAST LUMPECTOMY WITH NEEDLE LOCALIZATION;  Surgeon: Haywood Lasso, MD;  Location: [place];  Service: General;  Laterality: Right;  Needle Localized Removal Right Breast Mass   KNEE ARTHROSCOPY  06/1995   Dr. Nicholaus Bloom   LAPAROSCOPY  [phone]   PILONIDAL CYST EXCISION  1969   Dr. Vida Rigger   TONSILECTOMY/ADENOIDECTOMY WITH MYRINGOTOMY  12/1953     Current Meds  Medication Sig   b complex vitamins tablet Take 1 tablet by mouth daily.   calcium-vitamin D (OSCAL WITH D) 500-200 MG-UNIT tablet Take 1 tablet by mouth daily.   Coenzyme Q10 (COQ10) 100 MG CAPS Take 2 capsules by mouth daily.   Cyanocobalamin (VITAMIN B-12) 3000 MCG SUBL Place 1 tablet under the tongue daily as needed.   Multiple Vitamin (MULTIVITAMIN) tablet Take 1 tablet by mouth daily.   NON FORMULARY Take 300 mg by mouth daily.  MEGARED     Allergies:   Paxil [paroxetine hcl]; Prozac [fluoxetine hcl]; Sertraline hcl; Tape; and Wellbutrin [bupropion]   Social History   Tobacco Use   Smoking status: Former Smoker    Last attempt to quit: 03/23/1980    Years since quitting: 38.2   Smokeless tobacco: Never Used  Substance Use Topics   Alcohol use: Yes   Drug use: No     Family Hx: The patient's family history includes Cancer in her maternal aunt and mother; Congestive Heart Failure in her sister and sister; Diabetes in her sister; Stroke in her father; Tuberculosis in her maternal grandfather.  ROS:   Please see the history of present illness.     All other systems reviewed and are negative.   Prior CV studies:   The following studies were reviewed today:  Exercise tolerance test 04/01/2018, echocardiogram 04/06/2018  Labs/Other Tests and Data Reviewed:    EKG:  No ECG reviewed.  Recent Labs: No results found for requested labs within last 8760 hours.   Recent Lipid Panel No results found  for: CHOL, TRIG, HDL, CHOLHDL, LDLCALC, LDLDIRECT  Wt Readings from Last 3 Encounters:  06/22/18 136 lb (61.7 kg)  03/24/18 141 lb (64 kg)  08/14/15 137 lb (62.1 kg)     Objective:    Vital Signs:  BP 97/61    Pulse (!) 58    Ht 5\' 7"  (1.702 m)    Wt 136 lb (61.7 kg)    LMP  (LMP Unknown)    SpO2 100%    BMI 21.30 kg/m    VITAL SIGNS:  reviewed GEN:  no acute distress EYES:  sclerae anicteric, EOMI - Extraocular Movements Intact RESPIRATORY:  normal respiratory effort, symmetric expansion CARDIOVASCULAR:  no peripheral edema SKIN:  no rash, lesions or ulcers. MUSCULOSKELETAL:  no obvious deformities. NEURO:  alert and oriented x 3, no obvious focal deficit PSYCH:  normal affect  ASSESSMENT & PLAN:    1. Dyspnea, unspecified type   2. Aortic valve insufficiency, etiology of cardiac valve disease unspecified   3. Former smoker     Upper limit of normal ascending aorta measurement- Since she is  a former smoker, we will be sure to observe her aorta over time. Fortunately, she has had a normal ascending aorta measurement currently, and a normal AAA screen per her report, historically. If we notice change in her ascending aorta dimensions, we will be sure to obtain CTA aorta to obtain orthogonal measurements. I described that patients with ascending aorta aneurysms are discouraged from heavy lifting (anything that one must strain to lift), and we discussed the roll of losartan in mitigating maladaptive changes to the aorta.  We will recheck an echo in 1 year.  Mild to moderate aortic valve regurgitation- given normal LV parameters, this is unlikely to be the cause of her shortness of breath.  We will recheck her valvular regurgitation in 1 year with echo and screen her ascending aorta as well.  Dyspnea- stable and somewhat improved, discussed exercise and diet recommendations.  We also discussed medication therapy which may not be helpful at this time given low blood pressures.  She knows to contact the office if she has any worsening of symptoms and we certainly can investigate further.  We had a very pleasant and thorough discussion today, and she had no additional questions at the end of our visit.  COVID-19 Education: The signs and symptoms of COVID-19 were discussed with the patient and how to seek care for testing (follow up with PCP or arrange E-visit).  The importance of social distancing was discussed today.  Time:   Today, I have spent 44 minutes with the patient with telehealth technology discussing the above problems.     Medication Adjustments/Labs and Tests Ordered: Current medicines are reviewed at length with the patient today.  Concerns regarding medicines are outlined above.   Tests Ordered: No orders of the defined types were placed in this encounter.   Medication Changes: No orders of the defined types were placed in this encounter.   Disposition:  Follow up in 7  month(s)  Signed, Elouise Munroe, MD  06/22/2018 12:56 PM    [place]

## 2018-06-28 DIAGNOSIS — R002 Palpitations: Secondary | ICD-10-CM

## 2018-06-30 ENCOUNTER — Telehealth: Payer: Self-pay | Admitting: Radiology

## 2018-06-30 NOTE — Telephone Encounter (Signed)
Enrolled patient for a 14 day Zio monitor to be a mailed. Brief instructions were gone over with patient and she knows to expect the monitor to arrive in 3-4 days

## 2018-07-06 ENCOUNTER — Ambulatory Visit (INDEPENDENT_AMBULATORY_CARE_PROVIDER_SITE_OTHER): Payer: Medicare Other

## 2018-07-06 DIAGNOSIS — R002 Palpitations: Secondary | ICD-10-CM | POA: Diagnosis not present

## 2018-07-06 NOTE — Telephone Encounter (Signed)
Spoke to patient. Per our [employer] rep the monitor is a patented hypoallergenic adhesive formula.

## 2018-07-06 NOTE — Telephone Encounter (Signed)
Called and confirmed with Ailene Ravel our [place] Rep. The monitors use a patented hypoallergenic adhesive formula.  She is not sure why someone would have said differently. Possible new? Like I stated on the phone unfortunately some people still have an allergic reaction to the monitor. If this happens to use please contact your doctor and they can decide other options for you

## 2018-07-06 NOTE — Telephone Encounter (Signed)
Patient needs hypoallergenic adhesive for the monitor and what she has is not hypoallergenic. Please call

## 2018-07-27 DIAGNOSIS — M7661 Achilles tendinitis, right leg: Secondary | ICD-10-CM | POA: Diagnosis not present

## 2018-07-27 DIAGNOSIS — R002 Palpitations: Secondary | ICD-10-CM | POA: Diagnosis not present

## 2018-07-28 ENCOUNTER — Other Ambulatory Visit: Payer: Self-pay

## 2018-08-13 DIAGNOSIS — Z0000 Encounter for general adult medical examination without abnormal findings: Secondary | ICD-10-CM | POA: Diagnosis not present

## 2018-08-13 DIAGNOSIS — Z1389 Encounter for screening for other disorder: Secondary | ICD-10-CM | POA: Diagnosis not present

## 2018-08-13 DIAGNOSIS — F324 Major depressive disorder, single episode, in partial remission: Secondary | ICD-10-CM | POA: Diagnosis not present

## 2018-08-13 DIAGNOSIS — I77810 Thoracic aortic ectasia: Secondary | ICD-10-CM | POA: Diagnosis not present

## 2018-08-13 DIAGNOSIS — E7800 Pure hypercholesterolemia, unspecified: Secondary | ICD-10-CM | POA: Diagnosis not present

## 2018-08-13 DIAGNOSIS — I351 Nonrheumatic aortic (valve) insufficiency: Secondary | ICD-10-CM | POA: Diagnosis not present

## 2018-08-17 DIAGNOSIS — E7800 Pure hypercholesterolemia, unspecified: Secondary | ICD-10-CM | POA: Diagnosis not present

## 2018-08-17 DIAGNOSIS — I77810 Thoracic aortic ectasia: Secondary | ICD-10-CM | POA: Diagnosis not present

## 2018-08-17 DIAGNOSIS — I351 Nonrheumatic aortic (valve) insufficiency: Secondary | ICD-10-CM | POA: Diagnosis not present

## 2018-09-07 DIAGNOSIS — M7661 Achilles tendinitis, right leg: Secondary | ICD-10-CM | POA: Diagnosis not present

## 2018-09-10 DIAGNOSIS — Z23 Encounter for immunization: Secondary | ICD-10-CM | POA: Diagnosis not present

## 2018-10-07 DIAGNOSIS — H35033 Hypertensive retinopathy, bilateral: Secondary | ICD-10-CM | POA: Diagnosis not present

## 2018-10-07 DIAGNOSIS — H35371 Puckering of macula, right eye: Secondary | ICD-10-CM | POA: Diagnosis not present

## 2018-10-07 DIAGNOSIS — H2513 Age-related nuclear cataract, bilateral: Secondary | ICD-10-CM | POA: Diagnosis not present

## 2018-10-07 DIAGNOSIS — H3561 Retinal hemorrhage, right eye: Secondary | ICD-10-CM | POA: Diagnosis not present

## 2018-10-07 DIAGNOSIS — H35363 Drusen (degenerative) of macula, bilateral: Secondary | ICD-10-CM | POA: Diagnosis not present

## 2018-10-18 ENCOUNTER — Encounter (INDEPENDENT_AMBULATORY_CARE_PROVIDER_SITE_OTHER): Payer: Medicare Other | Admitting: Ophthalmology

## 2018-10-18 ENCOUNTER — Other Ambulatory Visit: Payer: Self-pay

## 2018-10-18 DIAGNOSIS — H35373 Puckering of macula, bilateral: Secondary | ICD-10-CM | POA: Diagnosis not present

## 2018-10-18 DIAGNOSIS — H43813 Vitreous degeneration, bilateral: Secondary | ICD-10-CM

## 2018-10-18 DIAGNOSIS — H2513 Age-related nuclear cataract, bilateral: Secondary | ICD-10-CM

## 2018-12-09 DIAGNOSIS — L853 Xerosis cutis: Secondary | ICD-10-CM | POA: Diagnosis not present

## 2018-12-09 DIAGNOSIS — D2371 Other benign neoplasm of skin of right lower limb, including hip: Secondary | ICD-10-CM | POA: Diagnosis not present

## 2018-12-09 DIAGNOSIS — L814 Other melanin hyperpigmentation: Secondary | ICD-10-CM | POA: Diagnosis not present

## 2018-12-09 DIAGNOSIS — D1801 Hemangioma of skin and subcutaneous tissue: Secondary | ICD-10-CM | POA: Diagnosis not present

## 2018-12-09 DIAGNOSIS — L280 Lichen simplex chronicus: Secondary | ICD-10-CM | POA: Diagnosis not present

## 2018-12-09 DIAGNOSIS — Z1211 Encounter for screening for malignant neoplasm of colon: Secondary | ICD-10-CM | POA: Diagnosis not present

## 2019-02-10 DIAGNOSIS — H35371 Puckering of macula, right eye: Secondary | ICD-10-CM | POA: Diagnosis not present

## 2019-02-10 DIAGNOSIS — H43811 Vitreous degeneration, right eye: Secondary | ICD-10-CM | POA: Diagnosis not present

## 2019-02-21 ENCOUNTER — Other Ambulatory Visit: Payer: Self-pay | Admitting: Internal Medicine

## 2019-02-21 DIAGNOSIS — Z1231 Encounter for screening mammogram for malignant neoplasm of breast: Secondary | ICD-10-CM

## 2019-03-02 DIAGNOSIS — H43391 Other vitreous opacities, right eye: Secondary | ICD-10-CM | POA: Diagnosis not present

## 2019-03-02 DIAGNOSIS — H25043 Posterior subcapsular polar age-related cataract, bilateral: Secondary | ICD-10-CM | POA: Diagnosis not present

## 2019-03-02 DIAGNOSIS — H2513 Age-related nuclear cataract, bilateral: Secondary | ICD-10-CM | POA: Diagnosis not present

## 2019-03-02 DIAGNOSIS — H25013 Cortical age-related cataract, bilateral: Secondary | ICD-10-CM | POA: Diagnosis not present

## 2019-03-23 NOTE — Progress Notes (Signed)
Cardiology Office Note:    Date:  03/24/2019   ID:  Deanna Velasquez, DOB [birth year], MRN [id number]  PCP:  Seward Carol, MD  Cardiologist:  Elouise Munroe, MD  Electrophysiologist:  None   Referring MD: Seward Carol, MD   Chief Complaint: Family history of AAA with rupture, DOE.   History of Present Illness:    Deanna Velasquez is a 69 y.o. female with a history of depression, IBS, and cataracts. CV hx includes mild-moderate MR, borderline ascending aorta measurement, and dyspnea on exertion with grade 1 diastolic dysfunction.  Continues to have DOE, mild. Wonders if rare episodes of chest sensation are related to aortic aneurysm. We discussed that 37 mm ascending aorta may be within the normal limits for her when indexed to BSA for age. In addition, highly unlikely that chest pain is from the aorta at a normal size. Discussed natural history again and monitoring. She is avoiding heavy lifting per her preference after our last discussion.  Echo scheduled for April. No repeat imaging available to review.  Remains otherwise asymptomatic.  Past Medical History:  Diagnosis Date  . ADHD   . Cataracts, bilateral   . Depression   . GERD (gastroesophageal reflux disease)   . IBS (irritable bowel syndrome)   . No pertinent past medical history     Past Surgical History:  Procedure Laterality Date  . BREAST EXCISIONAL BIOPSY Right    benign  . BREAST LUMPECTOMY WITH NEEDLE LOCALIZATION  12/29/2011   Procedure: BREAST LUMPECTOMY WITH NEEDLE LOCALIZATION;  Surgeon: Haywood Lasso, MD;  Location: [place];  Service: General;  Laterality: Right;  Needle Localized Removal Right Breast Mass  . KNEE ARTHROSCOPY  06/1995   Dr. Nicholaus Bloom  . LAPAROSCOPY  04/1983  . PILONIDAL CYST EXCISION  1969   Dr. Vida Rigger  . TONSILECTOMY/ADENOIDECTOMY WITH MYRINGOTOMY  12/1953    Current Medications: Current Meds  Medication Sig  . b complex vitamins tablet Take 1 tablet by mouth  daily.  . calcium-vitamin D (OSCAL WITH D) 500-200 MG-UNIT tablet Take 1 tablet by mouth daily.  . Coenzyme Q10 (COQ10) 100 MG CAPS Take 2 capsules by mouth daily.  . Cyanocobalamin (VITAMIN B-12) 3000 MCG SUBL Place 1 tablet under the tongue daily as needed.  . Multiple Vitamin (MULTIVITAMIN) tablet Take 1 tablet by mouth daily.  . Multiple Vitamins-Minerals (PRESERVISION AREDS PO) Take by mouth.  . NON FORMULARY Take 300 mg by mouth daily. MEGARED     Allergies:   Paxil [paroxetine hcl], Prozac [fluoxetine hcl], Sertraline hcl, Tape, and Wellbutrin [bupropion]   Social History   Socioeconomic History  . Marital status: Divorced    Spouse name: Not on file  . Number of children: Not on file  . Years of education: Not on file  . Highest education level: Not on file  Occupational History  . Not on file  Tobacco Use  . Smoking status: Former Smoker    Quit date: 03/23/1980    Years since quitting: 39.0  . Smokeless tobacco: Never Used  Substance and Sexual Activity  . Alcohol use: Yes  . Drug use: No  . Sexual activity: Not on file  Other Topics Concern  . Not on file  Social History Narrative  . Not on file   Social Determinants of Health   Financial Resource Strain:   . Difficulty of Paying Living Expenses: Not on file  Food Insecurity:   . Worried About Charity fundraiser in the Last  Year: Not on file  . Ran Out of Food in the Last Year: Not on file  Transportation Needs:   . Lack of Transportation (Medical): Not on file  . Lack of Transportation (Non-Medical): Not on file  Physical Activity:   . Days of Exercise per Week: Not on file  . Minutes of Exercise per Session: Not on file  Stress:   . Feeling of Stress : Not on file  Social Connections:   . Frequency of Communication with Friends and Family: Not on file  . Frequency of Social Gatherings with Friends and Family: Not on file  . Attends Religious Services: Not on file  . Active Member of Clubs or  Organizations: Not on file  . Attends Archivist Meetings: Not on file  . Marital Status: Not on file     Family History: The patient's family history includes Cancer in her maternal aunt and mother; Congestive Heart Failure in her sister and sister; Diabetes in her sister; Stroke in her father; Tuberculosis in her maternal grandfather.  ROS:   Please see the history of present illness.    All other systems reviewed and are negative.  EKGs/Labs/Other Studies Reviewed:    The following studies were reviewed today:  EKG:  Nsr, rate 64  Recent Labs: No results found for requested labs within last 8760 hours.  Recent Lipid Panel No results found for: CHOL, TRIG, HDL, CHOLHDL, VLDL, LDLCALC, LDLDIRECT  Physical Exam:    VS:  BP 128/72   Pulse 67   Temp 97.9 F (36.6 C)   Ht 5\' 7"  (1.702 m)   Wt 133 lb 3.2 oz (60.4 kg)   LMP  (LMP Unknown)   SpO2 98%   BMI 20.86 kg/m     Wt Readings from Last 5 Encounters:  03/24/19 133 lb 3.2 oz (60.4 kg)  06/22/18 136 lb (61.7 kg)  03/24/18 141 lb (64 kg)  08/14/15 137 lb (62.1 kg)  01/16/12 130 lb (59 kg)     Constitutional: No acute distress Eyes: sclera non-icteric, normal conjunctiva and lids ENMT: mask in place Cardiovascular: regular rhythm, normal rate, 1/6 diastolic murmur over left sternal border. S1 and S2 normal. Radial pulses normal bilaterally. No jugular venous distention.  Respiratory: clear to auscultation bilaterally GI : normal bowel sounds, soft and nontender. No distention.   MSK: extremities warm, well perfused. No edema.  NEURO: grossly nonfocal exam, moves all extremities. PSYCH: alert and oriented x 3, normal mood and affect.      ASSESSMENT:    1. Palpitations   2. Aortic valve insufficiency, etiology of cardiac valve disease unspecified   3. Dyspnea, unspecified type   4. Former smoker    PLAN:    Upper limit of normal ascending aorta measurement-Repeat echo pending, discussed natural  history, precautions, and monitoring plan.  Mild to moderate aortic valve regurgitation- repeat echo pending  Dyspnea- stable and somewhat improved. Discussed using low dose lasix, she defers at this time.   Total time of encounter: 30 minutes total time of encounter, including 20 minutes spent in face-to-face patient care. This time includes coordination of care and counseling regarding above mentioned problem list. Remainder of non-face-to-face time involved reviewing chart documents/testing relevant to the patient encounter and documentation in the medical record. I have independently reviewed documentation from referring provider.   Cherlynn Kaiser, MD   [facility]    Medication Adjustments/Labs and Tests Ordered: Current medicines are reviewed at length with the patient today.  Concerns regarding medicines are outlined above.  Orders Placed This Encounter  Procedures  . EKG 12-Lead  . ECHOCARDIOGRAM COMPLETE   No orders of the defined types were placed in this encounter.   Patient Instructions  Medication Instructions:  No changes *If you need a refill on your cardiac medications before your next appointment, please call your pharmacy*  Lab Work:   Testing/Procedures: Your physician has requested that you have an echocardiogram. Echocardiography is a painless test that uses sound waves to create images of your heart. It provides your doctor with information about the size and shape of your heart and how well your heart's chambers and valves are working. This procedure takes approximately one hour. There are no restrictions for this procedure.   At [facility], you and your health needs are our priority.  As part of our continuing mission to provide you with exceptional heart care, we have created designated Provider Care Teams.  These Care Teams include your primary Cardiologist (physician) and Advanced Practice Providers (APPs -  Physician Assistants and  Nurse Practitioners) who all work together to provide you with the care you need, when you need it.  Your next appointment:   6 week(s)  The format for your next appointment:   Virtual Visit   Provider:   Cherlynn Kaiser, MD  Other Instructions n/a

## 2019-03-24 ENCOUNTER — Ambulatory Visit (INDEPENDENT_AMBULATORY_CARE_PROVIDER_SITE_OTHER): Payer: Medicare Other | Admitting: Internal Medicine

## 2019-03-24 ENCOUNTER — Other Ambulatory Visit: Payer: Self-pay

## 2019-03-24 ENCOUNTER — Encounter: Payer: Self-pay | Admitting: Internal Medicine

## 2019-03-24 VITALS — BP 128/72 | HR 67 | Temp 97.9°F | Ht 67.0 in | Wt 133.2 lb

## 2019-03-24 DIAGNOSIS — R0600 Dyspnea, unspecified: Secondary | ICD-10-CM

## 2019-03-24 DIAGNOSIS — R002 Palpitations: Secondary | ICD-10-CM

## 2019-03-24 DIAGNOSIS — Z87891 Personal history of nicotine dependence: Secondary | ICD-10-CM | POA: Diagnosis not present

## 2019-03-24 DIAGNOSIS — I351 Nonrheumatic aortic (valve) insufficiency: Secondary | ICD-10-CM

## 2019-03-24 NOTE — Patient Instructions (Signed)
Medication Instructions:  No changes *If you need a refill on your cardiac medications before your next appointment, please call your pharmacy*  Lab Work:   Testing/Procedures: Your physician has requested that you have an echocardiogram. Echocardiography is a painless test that uses sound waves to create images of your heart. It provides your doctor with information about the size and shape of your heart and how well your heart's chambers and valves are working. This procedure takes approximately one hour. There are no restrictions for this procedure.   At [facility], you and your health needs are our priority.  As part of our continuing mission to provide you with exceptional heart care, we have created designated Provider Care Teams.  These Care Teams include your primary Cardiologist (physician) and Advanced Practice Providers (APPs -  Physician Assistants and Nurse Practitioners) who all work together to provide you with the care you need, when you need it.  Your next appointment:   6 week(s)  The format for your next appointment:   Virtual Visit   Provider:   Cherlynn Kaiser, MD  Other Instructions n/a

## 2019-03-28 DIAGNOSIS — H25043 Posterior subcapsular polar age-related cataract, bilateral: Secondary | ICD-10-CM | POA: Diagnosis not present

## 2019-03-28 DIAGNOSIS — H2513 Age-related nuclear cataract, bilateral: Secondary | ICD-10-CM | POA: Diagnosis not present

## 2019-03-28 DIAGNOSIS — H35341 Macular cyst, hole, or pseudohole, right eye: Secondary | ICD-10-CM | POA: Diagnosis not present

## 2019-03-28 DIAGNOSIS — H25013 Cortical age-related cataract, bilateral: Secondary | ICD-10-CM | POA: Diagnosis not present

## 2019-04-04 ENCOUNTER — Encounter (INDEPENDENT_AMBULATORY_CARE_PROVIDER_SITE_OTHER): Payer: Medicare Other | Admitting: Ophthalmology

## 2019-04-04 DIAGNOSIS — H2511 Age-related nuclear cataract, right eye: Secondary | ICD-10-CM

## 2019-04-04 DIAGNOSIS — H35371 Puckering of macula, right eye: Secondary | ICD-10-CM

## 2019-04-04 DIAGNOSIS — H43813 Vitreous degeneration, bilateral: Secondary | ICD-10-CM

## 2019-04-07 ENCOUNTER — Other Ambulatory Visit: Payer: Self-pay

## 2019-04-07 ENCOUNTER — Ambulatory Visit
Admission: RE | Admit: 2019-04-07 | Discharge: 2019-04-07 | Disposition: A | Discharge status: Home / Self Care | Payer: Medicare Other | Source: Ambulatory Visit | Attending: Internal Medicine | Admitting: Internal Medicine

## 2019-04-07 DIAGNOSIS — Z1231 Encounter for screening mammogram for malignant neoplasm of breast: Secondary | ICD-10-CM | POA: Diagnosis not present

## 2019-04-15 ENCOUNTER — Telehealth: Payer: Medicare Other | Admitting: Internal Medicine

## 2019-04-20 DIAGNOSIS — H2513 Age-related nuclear cataract, bilateral: Secondary | ICD-10-CM | POA: Diagnosis not present

## 2019-04-20 DIAGNOSIS — H25013 Cortical age-related cataract, bilateral: Secondary | ICD-10-CM | POA: Diagnosis not present

## 2019-04-20 DIAGNOSIS — H25043 Posterior subcapsular polar age-related cataract, bilateral: Secondary | ICD-10-CM | POA: Diagnosis not present

## 2019-04-28 ENCOUNTER — Other Ambulatory Visit: Payer: Self-pay

## 2019-04-28 ENCOUNTER — Ambulatory Visit (HOSPITAL_COMMUNITY): Payer: Medicare Other | Attending: Internal Medicine

## 2019-04-28 DIAGNOSIS — R002 Palpitations: Secondary | ICD-10-CM

## 2019-04-28 DIAGNOSIS — R0600 Dyspnea, unspecified: Secondary | ICD-10-CM

## 2019-04-28 DIAGNOSIS — I351 Nonrheumatic aortic (valve) insufficiency: Secondary | ICD-10-CM

## 2019-04-29 ENCOUNTER — Telehealth: Payer: Self-pay | Admitting: *Deleted

## 2019-04-29 DIAGNOSIS — I351 Nonrheumatic aortic (valve) insufficiency: Secondary | ICD-10-CM

## 2019-04-29 DIAGNOSIS — I779 Disorder of arteries and arterioles, unspecified: Secondary | ICD-10-CM

## 2019-04-29 NOTE — Telephone Encounter (Signed)
Spoke to patient via telephone earlier today . She is aware test has been ordered

## 2019-04-29 NOTE — Telephone Encounter (Signed)
The patient has been notified of the result and verbalized understanding.  All questions (if any) were answered.   Aware CTA aorta schedule , will attempt to have it complete before 05/10/19  Appointment. Raiford Simmonds, RN 04/29/2019 9:13 AM

## 2019-04-29 NOTE — Telephone Encounter (Signed)
-----   Message from Elouise Munroe, MD sent at 04/28/2019  7:23 PM EDT ----- Aorta is 41 mm (mildly dilated), please arrange for a CT angio of aorta. Mychart results sent.

## 2019-05-02 NOTE — Telephone Encounter (Signed)
Left message for patient to call and schedule CTA chest aorta orderedby Dr. Margaretann Loveless

## 2019-05-03 ENCOUNTER — Other Ambulatory Visit: Payer: Self-pay | Admitting: *Deleted

## 2019-05-03 DIAGNOSIS — R0600 Dyspnea, unspecified: Secondary | ICD-10-CM

## 2019-05-03 DIAGNOSIS — R002 Palpitations: Secondary | ICD-10-CM

## 2019-05-03 DIAGNOSIS — I351 Nonrheumatic aortic (valve) insufficiency: Secondary | ICD-10-CM

## 2019-05-03 NOTE — Telephone Encounter (Signed)
Spoke to patient she was concerned  About what type of contrast is needed when she has  CTA chest of aorta .  Rn informed patient not sure of the contrast medium will  Have investigate and contact her back.   patient states know one told her ,That she need to have  Labs and why - RN explain to patient about labs and when she needed have lab to be completed  Now and by April 15,2021

## 2019-05-03 NOTE — Telephone Encounter (Signed)
[facility] uses omnipaque (isovue) .. she will receive probably 59ml based on her wt and BMI. This is administered through an IV in the inside of her elbow.   It feels warm and she will experience a flash of warmth from her neck to her groin that lasts up to 2 mins (NORMAL RESPONSE). - some women report the feel like they have voided on themselves.  The labs are to check kidney function, primarily a creatinine and GFR. A GFR <45 warrants, an addition IV hydration protocol.  We want to ensure her kidneys are healthy enough to filter contrast and not further damage them. Good hydration will help.  Let me know if she wants further info and I can talk to her  [facility] [phone] Office  [phone] Cell

## 2019-05-03 NOTE — Telephone Encounter (Signed)
Iodine based contrast is used for any study that looks at the aorta, so that we can see it and measure it.   This CT scan is not mandatory, of course, but I would recommend it if she is concerned about growth of aneurysm. This is a guideline directed recommendation.  We have discussed this briefly in the past, and I sent a personal mychart message to the patient about the CT recommendation, but I'd be happy to explain this further, the best venue would be a virtual visit so I can answer her questions next week, since I am on hospital service this week and through the weekend.   Otherwise, please pass along this patient information from the [facility] about CT scans and aortic aneurysms, which should help answer the initial questions and guide our discussion should she choose to have an appointment to discuss.  [email or web address]  [email or web address]

## 2019-05-03 NOTE — Telephone Encounter (Signed)
I called patient to discuss her appointment for the CTA chest/aorta ordered by Dr. Margaretann Loveless.  Patient states she was unaware she would be getting contrast with this study and also states no one has really explained anything to her about this.  Please call

## 2019-05-04 NOTE — Telephone Encounter (Addendum)
Late entry spoke to patient earlier today .  Information given to patient concerning what type of  Contrast is used for CT of aorta.  website were sent via MyChart for patient to review. Per patient request.    patient states she would need to discuss with the doctor prior to test.  patient verbalized understanding and thanked RN for getting the information.

## 2019-05-09 ENCOUNTER — Telehealth: Payer: Self-pay | Admitting: Internal Medicine

## 2019-05-09 NOTE — Telephone Encounter (Signed)
Patient returned my call and states she will be in this week for labs

## 2019-05-09 NOTE — Telephone Encounter (Signed)
Left message for patient to call regarding appointment for CTA chest/aorta---patient needs to have lab work drawn this week.

## 2019-05-10 ENCOUNTER — Telehealth: Payer: Medicare Other | Admitting: Internal Medicine

## 2019-05-10 DIAGNOSIS — R0600 Dyspnea, unspecified: Secondary | ICD-10-CM | POA: Diagnosis not present

## 2019-05-10 DIAGNOSIS — I351 Nonrheumatic aortic (valve) insufficiency: Secondary | ICD-10-CM | POA: Diagnosis not present

## 2019-05-10 DIAGNOSIS — R002 Palpitations: Secondary | ICD-10-CM | POA: Diagnosis not present

## 2019-05-11 LAB — BASIC METABOLIC PANEL
BUN/Creatinine Ratio: 14 (ref 12–28)
BUN: 9 mg/dL (ref 8–27)
CO2: 24 mmol/L (ref 20–29)
Calcium: 9.8 mg/dL (ref 8.7–10.3)
Chloride: 99 mmol/L (ref 96–106)
Creatinine, Ser: 0.63 mg/dL (ref 0.57–1.00)
GFR calc Af Amer: 104 mL/min/{1.73_m2} (ref >59)
GFR calc non Af Amer: 91 mL/min/{1.73_m2} (ref >59)
Glucose: 91 mg/dL (ref 65–99)
Potassium: 4.4 mmol/L (ref 3.5–5.2)
Sodium: 136 mmol/L (ref 134–144)

## 2019-05-16 ENCOUNTER — Ambulatory Visit (HOSPITAL_COMMUNITY)
Admission: RE | Admit: 2019-05-16 | Discharge: 2019-05-16 | Disposition: A | Discharge status: Home / Self Care | Payer: Medicare Other | Source: Ambulatory Visit | Attending: Internal Medicine | Admitting: Internal Medicine

## 2019-05-16 ENCOUNTER — Other Ambulatory Visit: Payer: Self-pay

## 2019-05-16 DIAGNOSIS — I351 Nonrheumatic aortic (valve) insufficiency: Secondary | ICD-10-CM | POA: Diagnosis not present

## 2019-05-16 DIAGNOSIS — I779 Disorder of arteries and arterioles, unspecified: Secondary | ICD-10-CM | POA: Insufficient documentation

## 2019-05-16 DIAGNOSIS — I712 Thoracic aortic aneurysm, without rupture: Secondary | ICD-10-CM | POA: Diagnosis not present

## 2019-05-16 MED ORDER — IOHEXOL 350 MG/ML SOLN
100.0000 mL | Freq: Once | INTRAVENOUS | Status: AC | PRN
  Administered 2019-05-16: 70 mL via INTRAVENOUS

## 2019-05-18 ENCOUNTER — Ambulatory Visit (INDEPENDENT_AMBULATORY_CARE_PROVIDER_SITE_OTHER): Payer: Medicare Other | Admitting: Internal Medicine

## 2019-05-18 ENCOUNTER — Telehealth: Payer: Medicare Other | Admitting: Internal Medicine

## 2019-05-18 ENCOUNTER — Encounter: Payer: Self-pay | Admitting: Internal Medicine

## 2019-05-18 ENCOUNTER — Other Ambulatory Visit: Payer: Self-pay

## 2019-05-18 VITALS — BP 104/68 | HR 64 | Temp 97.3°F | Ht 67.0 in | Wt 136.0 lb

## 2019-05-18 DIAGNOSIS — Z87891 Personal history of nicotine dependence: Secondary | ICD-10-CM | POA: Diagnosis not present

## 2019-05-18 DIAGNOSIS — I77810 Thoracic aortic ectasia: Secondary | ICD-10-CM

## 2019-05-18 DIAGNOSIS — I351 Nonrheumatic aortic (valve) insufficiency: Secondary | ICD-10-CM | POA: Diagnosis not present

## 2019-05-18 DIAGNOSIS — R0600 Dyspnea, unspecified: Secondary | ICD-10-CM

## 2019-05-18 NOTE — Patient Instructions (Signed)
Medication Instructions:  CONTINUE WITH CURRENT MEDICATIONS. NO CHANGES.  *If you need a refill on your cardiac medications before your next appointment, please call your pharmacy*  Follow-Up: At [facility], you and your health needs are our priority.  As part of our continuing mission to provide you with exceptional heart care, we have created designated Provider Care Teams.  These Care Teams include your primary Cardiologist (physician) and Advanced Practice Providers (APPs -  Physician Assistants and Nurse Practitioners) who all work together to provide you with the care you need, when you need it.  We recommend signing up for the patient portal called "MyChart".  Sign up information is provided on this After Visit Summary.  MyChart is used to connect with patients for Virtual Visits (Telemedicine).  Patients are able to view lab/test results, encounter notes, upcoming appointments, etc.  Non-urgent messages can be sent to your provider as well.   To learn more about what you can do with MyChart, go to [email or web address].    Your next appointment:   6 month(s)  The format for your next appointment:   In Person  Provider:   Gayatri Acharya, MD     

## 2019-06-03 NOTE — Progress Notes (Signed)
Cardiology Office Note:    Date:  05/18/2019   ID:  Deanna Velasquez, Deanna Velasquez Deanna Velasquez, MRN [id number]  PCP:  Deanna Carol, MD  Cardiologist:  Elouise Munroe, MD  Electrophysiologist:  None   Referring MD: Deanna Carol, MD   Chief Complaint: ascending aorta dilation  History of Present Illness:    Deanna Velasquez is a 69 y.o. female who presents for follow up of CTA aorta.   CTA shows aorta at approximately 42 mm at the mid ascending aorta. Discussed weight restrictions (no straining to lift) and discussed role of BB or ARB in slowing growth of aorta. We discussed that if measurements are similar in 1 year, it is unlikely that her aorta will grow significantly over time if risk factors are controlled. We will continue to perform routine screening of aorta size.   She requests a referral to pulmonary medicine for her incidentally noted pulmonary nodules. This would be best coordinated by her primary care physician. With smoking history, CT should be repeated in 1 year.    She does not have significant chest pain, palpations or shortness of breath today.   Past Medical History:  Diagnosis Date  . ADHD   . Cataracts, bilateral   . Depression   . GERD (gastroesophageal reflux disease)   . IBS (irritable bowel syndrome)   . No pertinent past medical history     Past Surgical History:  Procedure Laterality Date  . BREAST EXCISIONAL BIOPSY Right    benign  . BREAST LUMPECTOMY Right 2003   benign  . BREAST LUMPECTOMY WITH NEEDLE LOCALIZATION  12/29/2011   Procedure: BREAST LUMPECTOMY WITH NEEDLE LOCALIZATION;  Surgeon: Haywood Lasso, MD;  Location: [place];  Service: General;  Laterality: Right;  Needle Localized Removal Right Breast Mass  . KNEE ARTHROSCOPY  06/1995   Dr. Nicholaus Bloom  . LAPAROSCOPY  04/1983  . PILONIDAL CYST EXCISION  1969   Dr. Vida Rigger  . TONSILECTOMY/ADENOIDECTOMY WITH MYRINGOTOMY  12/1953    Current Medications: Current Meds   Medication Sig  . b complex vitamins tablet Take 1 tablet by mouth daily.  . Coenzyme Q10 (COQ10) 100 MG CAPS Take 1 capsule by mouth daily.   . Cyanocobalamin (VITAMIN B-12) 3000 MCG SUBL Place 2-3 tablets under the tongue daily as needed.   . Multiple Vitamin (MULTIVITAMIN) tablet Take 1 tablet by mouth daily.  . Multiple Vitamins-Minerals (PRESERVISION AREDS PO) Take by mouth.  . [DISCONTINUED] calcium-vitamin D (OSCAL WITH D) 500-200 MG-UNIT tablet Take 1 tablet by mouth daily.  . [DISCONTINUED] NON FORMULARY Take 300 mg by mouth daily. MEGARED     Allergies:   Paxil [paroxetine hcl], Prozac [fluoxetine hcl], Sertraline hcl, Tape, and Wellbutrin [bupropion]   Social History   Socioeconomic History  . Marital status: Divorced    Spouse name: Not on file  . Number of children: Not on file  . Years of education: Not on file  . Highest education level: Not on file  Occupational History  . Not on file  Tobacco Use  . Smoking status: Former Smoker    Quit date: 03/23/1980    Years since quitting: 39.2  . Smokeless tobacco: Never Used  Substance and Sexual Activity  . Alcohol use: Yes  . Drug use: No  . Sexual activity: Not on file  Other Topics Concern  . Not on file  Social History Narrative  . Not on file   Social Determinants of Health   Financial Resource Strain:   .  Difficulty of Paying Living Expenses:   Food Insecurity:   . Worried About Charity fundraiser in the Last Year:   . Arboriculturist in the Last Year:   Transportation Needs:   . Film/video editor (Medical):   [name] Kitchen Lack of Transportation (Non-Medical):   Physical Activity:   . Days of Exercise per Week:   . Minutes of Exercise per Session:   Stress:   . Feeling of Stress :   Social Connections:   . Frequency of Communication with Friends and Family:   . Frequency of Social Gatherings with Friends and Family:   . Attends Religious Services:   . Active Member of Clubs or Organizations:   .  Attends Archivist Meetings:   [name] Kitchen Marital Status:      Family History: The patient's family history includes Cancer in her maternal aunt and mother; Congestive Heart Failure in her sister and sister; Diabetes in her sister; Stroke in her father; Tuberculosis in her maternal grandfather.  ROS:   Please see the history of present illness.    All other systems reviewed and are negative.  EKGs/Labs/Other Studies Reviewed:    The following studies were reviewed today:  EKG:  n/a  I have independently reviewed the images from CTA chest  05/16/19.  Recent Labs: 05/10/2019: BUN 9; Creatinine, Ser 0.63; Potassium 4.4; Sodium 136  Recent Lipid Panel No results found for: CHOL, TRIG, HDL, CHOLHDL, VLDL, LDLCALC, LDLDIRECT  Physical Exam:    VS:  BP 104/68 (BP Location: Left Arm, Patient Position: Sitting, Cuff Size: Normal)   Pulse 64   Temp (!) 97.3 F (36.3 C)   Ht 5\' 7"  (1.702 m)   Wt 136 lb (61.7 kg)   LMP  (LMP Unknown)   BMI 21.30 kg/m     Wt Readings from Last 5 Encounters:  05/18/19 136 lb (61.7 kg)  03/24/19 133 lb 3.2 oz (60.4 kg)  06/22/18 136 lb (61.7 kg)  03/24/18 141 lb (64 kg)  08/14/15 137 lb (62.1 kg)     Constitutional: No acute distress Eyes: sclera non-icteric, normal conjunctiva and lids ENMT: mask in place Cardiovascular: regular rhythm, normal rate, no murmurs. S1 and S2 normal. Radial pulses normal bilaterally. No jugular venous distention.  Respiratory: clear to auscultation bilaterally GI : normal bowel sounds, soft and nontender. No distention.   MSK: extremities warm, well perfused. No edema.  NEURO: grossly nonfocal exam, moves all extremities. PSYCH: alert and oriented x 3, normal mood and affect.   ASSESSMENT:    1. Ascending aorta dilatation (HCC)   2. Dyspnea, unspecified type   3. Former smoker   4. Aortic valve insufficiency, etiology of cardiac valve disease unspecified    PLAN:    Ascending aorta dilatation (HCC) - 42 mm  at mid ascending aorta.  -natural history discussed in great detail. Surveillance strategy developed with patient input. We will plan for CTA in 1 year. Will follow up in 6 months, can consider repeat echocardiogram if patient prefers since she is quite concerned that aorta has grown. I discussed with her that I independently reviewed both echos and when measured by similar technique, no significant change has occurred and this is reassuring.   Pulmonary nodules - follow with CT in 1 year. Referral to pulmonary by patient request can be coordinated by PCP.   Dyspnea, unspecified type - no significant concerns.   Former smoker - follow nodules as above.   Aortic valve insufficiency, etiology  of cardiac valve disease unspecified - will reevaluate with echocardiogram intermittently, no change noted.   Total time of encounter: 35 minutes total time of encounter, including 25 minutes spent in face-to-face patient care on the date of this encounter. This time includes coordination of care and counseling regarding above mentioned problem list. Remainder of non-face-to-face time involved reviewing chart documents/testing relevant to the patient encounter and documentation in the medical record. I have independently reviewed documentation from referring provider.   Cherlynn Kaiser, MD [place]  [facility]    Medication Adjustments/Labs and Tests Ordered: Current medicines are reviewed at length with the patient today.  Concerns regarding medicines are outlined above.  No orders of the defined types were placed in this encounter.  No orders of the defined types were placed in this encounter.   Patient Instructions  Medication Instructions:  CONTINUE WITH CURRENT MEDICATIONS. NO CHANGES.  *If you need a refill on your cardiac medications before your next appointment, please call your pharmacy*   Follow-Up: At [facility], you and your health needs are our priority.  As part of our  continuing mission to provide you with exceptional heart care, we have created designated Provider Care Teams.  These Care Teams include your primary Cardiologist (physician) and Advanced Practice Providers (APPs -  Physician Assistants and Nurse Practitioners) who all work together to provide you with the care you need, when you need it.  We recommend signing up for the patient portal called "MyChart".  Sign up information is provided on this After Visit Summary.  MyChart is used to connect with patients for Virtual Visits (Telemedicine).  Patients are able to view lab/test results, encounter notes, upcoming appointments, etc.  Non-urgent messages can be sent to your provider as well.   To learn more about what you can do with MyChart, go to [email or web address].    Your next appointment:   6 month(s)  The format for your next appointment:   In Person  Provider:   Cherlynn Kaiser, MD

## 2019-08-10 DIAGNOSIS — H43391 Other vitreous opacities, right eye: Secondary | ICD-10-CM | POA: Diagnosis not present

## 2019-08-10 DIAGNOSIS — H2513 Age-related nuclear cataract, bilateral: Secondary | ICD-10-CM | POA: Diagnosis not present

## 2019-08-10 DIAGNOSIS — H25043 Posterior subcapsular polar age-related cataract, bilateral: Secondary | ICD-10-CM | POA: Diagnosis not present

## 2019-08-10 DIAGNOSIS — H35341 Macular cyst, hole, or pseudohole, right eye: Secondary | ICD-10-CM | POA: Diagnosis not present

## 2019-09-27 DIAGNOSIS — H35373 Puckering of macula, bilateral: Secondary | ICD-10-CM | POA: Diagnosis not present

## 2019-09-27 DIAGNOSIS — H35363 Drusen (degenerative) of macula, bilateral: Secondary | ICD-10-CM | POA: Diagnosis not present

## 2019-09-27 DIAGNOSIS — H2513 Age-related nuclear cataract, bilateral: Secondary | ICD-10-CM | POA: Diagnosis not present

## 2019-09-27 DIAGNOSIS — H25013 Cortical age-related cataract, bilateral: Secondary | ICD-10-CM | POA: Diagnosis not present

## 2019-10-19 ENCOUNTER — Other Ambulatory Visit: Payer: Self-pay

## 2019-10-19 ENCOUNTER — Encounter (INDEPENDENT_AMBULATORY_CARE_PROVIDER_SITE_OTHER): Payer: Medicare Other | Admitting: Ophthalmology

## 2019-10-19 DIAGNOSIS — H35341 Macular cyst, hole, or pseudohole, right eye: Secondary | ICD-10-CM

## 2019-10-19 DIAGNOSIS — H43813 Vitreous degeneration, bilateral: Secondary | ICD-10-CM | POA: Diagnosis not present

## 2019-10-19 DIAGNOSIS — H35371 Puckering of macula, right eye: Secondary | ICD-10-CM | POA: Diagnosis not present

## 2019-10-25 ENCOUNTER — Telehealth: Payer: Self-pay | Admitting: *Deleted

## 2019-10-25 NOTE — Telephone Encounter (Signed)
A message was left, re: her follow up visit. 

## 2019-10-31 ENCOUNTER — Telehealth: Payer: Self-pay | Admitting: Internal Medicine

## 2019-10-31 NOTE — Telephone Encounter (Signed)
lvm for patient to return call to get follow up scheduled with Acharya from recall list 

## 2019-11-09 DIAGNOSIS — Z23 Encounter for immunization: Secondary | ICD-10-CM | POA: Diagnosis not present

## 2019-11-14 DIAGNOSIS — H43811 Vitreous degeneration, right eye: Secondary | ICD-10-CM | POA: Diagnosis not present

## 2019-11-14 DIAGNOSIS — H2513 Age-related nuclear cataract, bilateral: Secondary | ICD-10-CM | POA: Diagnosis not present

## 2019-11-14 DIAGNOSIS — H35341 Macular cyst, hole, or pseudohole, right eye: Secondary | ICD-10-CM | POA: Diagnosis not present

## 2019-11-14 DIAGNOSIS — H43391 Other vitreous opacities, right eye: Secondary | ICD-10-CM | POA: Diagnosis not present

## 2019-11-30 DIAGNOSIS — F331 Major depressive disorder, recurrent, moderate: Secondary | ICD-10-CM | POA: Diagnosis not present

## 2019-12-06 DIAGNOSIS — F331 Major depressive disorder, recurrent, moderate: Secondary | ICD-10-CM | POA: Diagnosis not present

## 2019-12-13 DIAGNOSIS — F331 Major depressive disorder, recurrent, moderate: Secondary | ICD-10-CM | POA: Diagnosis not present

## 2019-12-20 DIAGNOSIS — F331 Major depressive disorder, recurrent, moderate: Secondary | ICD-10-CM | POA: Diagnosis not present

## 2019-12-27 DIAGNOSIS — F331 Major depressive disorder, recurrent, moderate: Secondary | ICD-10-CM | POA: Diagnosis not present

## 2020-01-10 DIAGNOSIS — F331 Major depressive disorder, recurrent, moderate: Secondary | ICD-10-CM | POA: Diagnosis not present

## 2020-02-23 DIAGNOSIS — L814 Other melanin hyperpigmentation: Secondary | ICD-10-CM | POA: Diagnosis not present

## 2020-02-23 DIAGNOSIS — L720 Epidermal cyst: Secondary | ICD-10-CM | POA: Diagnosis not present

## 2020-02-23 DIAGNOSIS — L821 Other seborrheic keratosis: Secondary | ICD-10-CM | POA: Diagnosis not present

## 2020-02-23 DIAGNOSIS — D225 Melanocytic nevi of trunk: Secondary | ICD-10-CM | POA: Diagnosis not present

## 2020-02-23 DIAGNOSIS — D1801 Hemangioma of skin and subcutaneous tissue: Secondary | ICD-10-CM | POA: Diagnosis not present

## 2020-03-09 ENCOUNTER — Other Ambulatory Visit: Payer: Self-pay | Admitting: Internal Medicine

## 2020-03-09 DIAGNOSIS — Z1231 Encounter for screening mammogram for malignant neoplasm of breast: Secondary | ICD-10-CM

## 2020-04-03 ENCOUNTER — Encounter: Payer: Self-pay | Admitting: Internal Medicine

## 2020-04-03 ENCOUNTER — Other Ambulatory Visit: Payer: Self-pay

## 2020-04-03 ENCOUNTER — Ambulatory Visit (INDEPENDENT_AMBULATORY_CARE_PROVIDER_SITE_OTHER): Payer: Medicare Other | Admitting: Internal Medicine

## 2020-04-03 VITALS — BP 110/72 | HR 73 | Ht 67.0 in | Wt 134.0 lb

## 2020-04-03 DIAGNOSIS — I712 Thoracic aortic aneurysm, without rupture, unspecified: Secondary | ICD-10-CM

## 2020-04-03 DIAGNOSIS — I77810 Thoracic aortic ectasia: Secondary | ICD-10-CM | POA: Diagnosis not present

## 2020-04-03 DIAGNOSIS — I351 Nonrheumatic aortic (valve) insufficiency: Secondary | ICD-10-CM

## 2020-04-03 NOTE — Patient Instructions (Signed)
Medication Instructions:  No Changes In Medications at this time.  *If you need a refill on your cardiac medications before your next appointment, please call your pharmacy*  Lab Work: BMET- 1 [place]  If you have labs (blood work) drawn today and your tests are completely normal, you will receive your results only by: [name] Kitchen MyChart Message (if you have MyChart) OR . A paper copy in the mail If you have any lab test that is abnormal or we need to change your treatment, we will call you to review the results.  Testing/Procedures: Your physician has requested that you have an echocardiogram in 1 year. Echocardiography is a painless test that uses sound waves to create images of your heart. It provides your doctor with information about the size and shape of your heart and how well your heart's chambers and valves are working. You may receive an ultrasound enhancing agent through an IV if needed to better visualize your heart during the echo.This procedure takes approximately one hour. There are no restrictions for this procedure. This will take place at the [address].   CTA OF CHEST- SOMEONE WILL REACH OUT TO YOU TO GET THIS SCHEDULED.   Follow-Up: At [facility], you and your health needs are our priority.  As part of our continuing mission to provide you with exceptional heart care, we have created designated Provider Care Teams.  These Care Teams include your primary Cardiologist (physician) and Advanced Practice Providers (APPs -  Physician Assistants and Nurse Practitioners) who all work together to provide you with the care you need, when you need it.  Your next appointment:   1 year(s)  The format for your next appointment:   In Person  Provider:   Cherlynn Kaiser, MD

## 2020-04-03 NOTE — Progress Notes (Signed)
Cardiology Office Note:    Date:  04/03/2020   ID:  Deanna, Velasquez [birth year], MRN [id number]  PCP:  Seward Carol, MD  Cardiologist:  Elouise Munroe, MD  Electrophysiologist:  None   Referring MD: Seward Carol, MD   Chief Complaint/Reason for Referral: Thoracic aortic aneurysm  History of Present Illness:    Deanna Velasquez is a 70 y.o. female with a history of thoracic aortic aneurysm who presents for follow-up.  Last CTA 1 year ago shows approximately 42 mm mid ascending aorta dilation.  This year CT is pending and we will order this after today's visit.  We reviewed that after reevaluation this year if findings are stable can follow with echo annually given good correlation and will intermittently obtain CTAs.  She will need echocardiograms intermittently as well to follow mild to moderate aortic valve regurgitation.  CT will also help in follow-up of pulmonary nodules.  Her half sister was recently diagnosed with metastatic breast cancer to the bone.  She has grown quite close to her sister over the last 5 years and finds comfort and support with her.  The patient denies chest pain, chest pressure, dyspnea at rest or with exertion, palpitations, PND, orthopnea, or leg swelling. Denies cough, fever, chills. Denies nausea, vomiting. Denies syncope or presyncope. Denies dizziness or lightheadedness.  Past Medical History:  Diagnosis Date  . ADHD   . Cataracts, bilateral   . Depression   . GERD (gastroesophageal reflux disease)   . IBS (irritable bowel syndrome)   . No pertinent past medical history     Past Surgical History:  Procedure Laterality Date  . BREAST EXCISIONAL BIOPSY Right    benign  . BREAST LUMPECTOMY Right 2003   benign  . BREAST LUMPECTOMY WITH NEEDLE LOCALIZATION  12/29/2011   Procedure: BREAST LUMPECTOMY WITH NEEDLE LOCALIZATION;  Surgeon: Haywood Lasso, MD;  Location: [place];  Service: General;  Laterality:  Right;  Needle Localized Removal Right Breast Mass  . KNEE ARTHROSCOPY  06/1995   Dr. Nicholaus Bloom  . LAPAROSCOPY  04/1983  . PILONIDAL CYST EXCISION  1969   Dr. Vida Rigger  . TONSILECTOMY/ADENOIDECTOMY WITH MYRINGOTOMY  12/1953    Current Medications: Current Meds  Medication Sig  . b complex vitamins tablet Take 1 tablet by mouth daily.  [name] Kitchen buPROPion (WELLBUTRIN XL) 150 MG 24 hr tablet Take 150 mg by mouth daily.  . Coenzyme Q10 (COQ10) 100 MG CAPS Take 1 capsule by mouth daily.   . Cyanocobalamin (VITAMIN B-12) 3000 MCG SUBL Place 2-3 tablets under the tongue daily as needed.   . Multiple Vitamin (MULTIVITAMIN) tablet Take 1 tablet by mouth daily.  . Multiple Vitamins-Minerals (PRESERVISION AREDS PO) Take by mouth.     Allergies:   Paxil [paroxetine hcl], Prozac [fluoxetine hcl], Sertraline hcl, and Tape   Social History   Tobacco Use  . Smoking status: Former Smoker    Quit date: 03/23/1980    Years since quitting: 40.0  . Smokeless tobacco: Never Used  Substance Use Topics  . Alcohol use: Yes  . Drug use: No     Family History: The patient's family history includes Cancer in her maternal aunt and mother; Congestive Heart Failure in her sister and sister; Diabetes in her sister; Stroke in her father; Tuberculosis in her maternal grandfather.  ROS:   Please see the history of present illness.    All other systems reviewed and are negative.  EKGs/Labs/Other Studies Reviewed:  The following studies were reviewed today:  EKG:  NSR, no change from 2020  Recent Labs: 05/10/2019: BUN 9; Creatinine, Ser 0.63; Potassium 4.4; Sodium 136  Recent Lipid Panel No results found for: CHOL, TRIG, HDL, CHOLHDL, VLDL, LDLCALC, LDLDIRECT  Physical Exam:    VS:  BP 110/72 (BP Location: Left Arm, Patient Position: Sitting, Cuff Size: Normal)   Pulse 73   Ht 5\' 7"  (1.702 m)   Wt 134 lb (60.8 kg)   LMP  (LMP Unknown)   BMI 20.99 kg/m     Wt Readings from Last 5 Encounters:  04/03/20 134  lb (60.8 kg)  05/18/19 136 lb (61.7 kg)  03/24/19 133 lb 3.2 oz (60.4 kg)  06/22/18 136 lb (61.7 kg)  03/24/18 141 lb (64 kg)    Constitutional: No acute distress Eyes: sclera non-icteric, normal conjunctiva and lids ENMT: normal dentition, moist mucous membranes Cardiovascular: regular rhythm, normal rate, no murmurs. S1 and S2 normal. Radial pulses normal bilaterally. No jugular venous distention.  Respiratory: clear to auscultation bilaterally GI : normal bowel sounds, soft and nontender. No distention.   MSK: extremities warm, well perfused. No edema.  NEURO: grossly nonfocal exam, moves all extremities. PSYCH: alert and oriented x 3, normal mood and affect.   ASSESSMENT:    1. Ascending aorta dilatation (HCC)   2. Thoracic aortic aneurysm without rupture ([place])   3. Aortic valve insufficiency, etiology of cardiac valve disease unspecified    PLAN:    Ascending aorta dilatation (HCC)  Thoracic aortic aneurysm without rupture ([place]) - Plan: EKG 12-Lead, CT ANGIO CHEST AORTA W/CM & OR WO/CM, ECHOCARDIOGRAM COMPLETE, Basic metabolic panel  -We will obtain CTA aorta this year.  If stable findings can follow with echo subsequently.  We will order an echocardiogram for 1 year from now to follow both aortic valve regurgitation and ascending aorta dilation.  Aortic valve insufficiency, etiology of cardiac valve disease unspecified -We will need intermittent follow-up with echo.   Total time of encounter: 20 minutes total time of encounter, including 15 minutes spent in face-to-face patient care on the date of this encounter. This time includes coordination of care and counseling regarding above mentioned problem list. Remainder of non-face-to-face time involved reviewing chart documents/testing relevant to the patient encounter and documentation in the medical record. I have independently reviewed documentation from referring provider.   Cherlynn Kaiser, MD, [facility]    Medication Adjustments/Labs and Tests Ordered: Current medicines are reviewed at length with the patient today.  Concerns regarding medicines are outlined above.   Orders Placed This Encounter  Procedures  . CT ANGIO CHEST AORTA W/CM & OR WO/CM  . Basic metabolic panel  . EKG 12-Lead  . ECHOCARDIOGRAM COMPLETE     No orders of the defined types were placed in this encounter.   Patient Instructions  Medication Instructions:  No Changes In Medications at this time.  *If you need a refill on your cardiac medications before your next appointment, please call your pharmacy*  Lab Work: BMET- 1 [place]  If you have labs (blood work) drawn today and your tests are completely normal, you will receive your results only by: [name] Kitchen MyChart Message (if you have MyChart) OR . A paper copy in the mail If you have any lab test that is abnormal or we need to change your treatment, we will call you to review the results.  Testing/Procedures: Your physician has requested that you have an  echocardiogram in 1 year. Echocardiography is a painless test that uses sound waves to create images of your heart. It provides your doctor with information about the size and shape of your heart and how well your heart's chambers and valves are working. You may receive an ultrasound enhancing agent through an IV if needed to better visualize your heart during the echo.This procedure takes approximately one hour. There are no restrictions for this procedure. This will take place at the [address]   CTA OF CHEST- SOMEONE WILL REACH OUT TO YOU TO GET THIS SCHEDULED.   Follow-Up: At [facility], you and your health needs are our priority.  As part of our continuing mission to provide you with exceptional heart care, we have created designated Provider Care Teams.  These Care Teams include your primary Cardiologist (physician) and Advanced Practice Providers (APPs -  Physician  Assistants and Nurse Practitioners) who all work together to provide you with the care you need, when you need it.  Your next appointment:   1 year(s)  The format for your next appointment:   In Person  Provider:   Cherlynn Kaiser, MD

## 2020-04-05 ENCOUNTER — Telehealth: Payer: Self-pay | Admitting: Internal Medicine

## 2020-04-05 NOTE — Telephone Encounter (Signed)
Spoke with patient regarding her CTA chest aorta appointment on 04/12/20---patient is asking if Dr Margaretann Loveless would be willing to order her "complete blood panel" since it will be sometime in the future before she can schedule with Dr. Delfina Redwood.  Please advise

## 2020-04-05 NOTE — Telephone Encounter (Signed)
Spoke with patient regarding 04/12/20 10:30 am CTA chest/aorta at [facility] time is 10:15 am --1st floor radiology for check in---Liquids only 4 hours prior to study.  Information is available in My Chart and patient voiced her understanding.

## 2020-04-05 NOTE — Telephone Encounter (Signed)
Left message for patient to call regarding the CTA chest aorta appointment scheduled 04/12/20 at 10:30 am at [facility] time is 10:15 am --1st floor radiology for chest in ---liquids only 4 hours prior to study.  Will continue to try and reach patient.

## 2020-04-09 ENCOUNTER — Other Ambulatory Visit: Payer: Self-pay

## 2020-04-09 DIAGNOSIS — Z79899 Other long term (current) drug therapy: Secondary | ICD-10-CM

## 2020-04-09 DIAGNOSIS — I779 Disorder of arteries and arterioles, unspecified: Secondary | ICD-10-CM | POA: Diagnosis not present

## 2020-04-09 DIAGNOSIS — I712 Thoracic aortic aneurysm, without rupture: Secondary | ICD-10-CM | POA: Diagnosis not present

## 2020-04-10 DIAGNOSIS — Z1231 Encounter for screening mammogram for malignant neoplasm of breast: Secondary | ICD-10-CM

## 2020-04-10 LAB — LIPID PANEL
Chol/HDL Ratio: 2.7 ratio (ref 0.0–4.4)
Cholesterol, Total: 178 mg/dL (ref 100–199)
HDL: 66 mg/dL (ref >39)
LDL Chol Calc (NIH): 91 mg/dL (ref 0–99)
Triglycerides: 119 mg/dL (ref 0–149)
VLDL Cholesterol Cal: 21 mg/dL (ref 5–40)

## 2020-04-10 LAB — BASIC METABOLIC PANEL
BUN/Creatinine Ratio: 10 — ABNORMAL LOW (ref 12–28)
BUN: 8 mg/dL (ref 8–27)
CO2: 19 mmol/L — ABNORMAL LOW (ref 20–29)
Calcium: 9.2 mg/dL (ref 8.7–10.3)
Chloride: 100 mmol/L (ref 96–106)
Creatinine, Ser: 0.77 mg/dL (ref 0.57–1.00)
Glucose: 89 mg/dL (ref 65–99)
Potassium: 4.3 mmol/L (ref 3.5–5.2)
Sodium: 137 mmol/L (ref 134–144)
eGFR: 82 mL/min/{1.73_m2} (ref >59)

## 2020-04-12 ENCOUNTER — Encounter (HOSPITAL_COMMUNITY): Payer: Self-pay

## 2020-04-12 ENCOUNTER — Ambulatory Visit (HOSPITAL_COMMUNITY)
Admission: RE | Admit: 2020-04-12 | Discharge: 2020-04-12 | Disposition: A | Discharge status: Home / Self Care | Payer: Medicare Other | Source: Ambulatory Visit | Attending: Internal Medicine | Admitting: Internal Medicine

## 2020-04-12 ENCOUNTER — Other Ambulatory Visit: Payer: Self-pay

## 2020-04-12 DIAGNOSIS — I712 Thoracic aortic aneurysm, without rupture, unspecified: Secondary | ICD-10-CM

## 2020-04-12 MED ORDER — IOHEXOL 350 MG/ML SOLN
100.0000 mL | Freq: Once | INTRAVENOUS | Status: AC | PRN
  Administered 2020-04-12: 100 mL via INTRAVENOUS

## 2020-05-02 ENCOUNTER — Telehealth: Payer: Self-pay | Admitting: Internal Medicine

## 2020-05-02 NOTE — Telephone Encounter (Signed)
Chriss Driver, RN  05/02/2020 4:47 PM EDT      Spoke with patient regarding her recent CTA aorta results. Patient made aware of Dr. Delphina Cahill comments and recommendations. Patient would like to know why scan needs to be repeated in 6 months instead of 12 months. Advised patient I would forward message to Dr. Margaretann Loveless for her to review and advise. Patient verbalized understanding.    Please see result note.

## 2020-05-02 NOTE — Telephone Encounter (Signed)
Routing to primary RN

## 2020-05-02 NOTE — Telephone Encounter (Signed)
Patient is returning call to discuss CT results. 

## 2020-05-09 DIAGNOSIS — Z1211 Encounter for screening for malignant neoplasm of colon: Secondary | ICD-10-CM | POA: Diagnosis not present

## 2020-05-14 DIAGNOSIS — I77810 Thoracic aortic ectasia: Secondary | ICD-10-CM | POA: Diagnosis not present

## 2020-05-14 DIAGNOSIS — I351 Nonrheumatic aortic (valve) insufficiency: Secondary | ICD-10-CM | POA: Diagnosis not present

## 2020-05-14 DIAGNOSIS — M8588 Other specified disorders of bone density and structure, other site: Secondary | ICD-10-CM | POA: Diagnosis not present

## 2020-05-14 DIAGNOSIS — F324 Major depressive disorder, single episode, in partial remission: Secondary | ICD-10-CM | POA: Diagnosis not present

## 2020-05-15 ENCOUNTER — Other Ambulatory Visit: Payer: Self-pay | Admitting: Internal Medicine

## 2020-05-15 DIAGNOSIS — M8580 Other specified disorders of bone density and structure, unspecified site: Secondary | ICD-10-CM

## 2020-05-16 DIAGNOSIS — H2513 Age-related nuclear cataract, bilateral: Secondary | ICD-10-CM | POA: Diagnosis not present

## 2020-05-16 DIAGNOSIS — H43811 Vitreous degeneration, right eye: Secondary | ICD-10-CM | POA: Diagnosis not present

## 2020-05-16 DIAGNOSIS — H43391 Other vitreous opacities, right eye: Secondary | ICD-10-CM | POA: Diagnosis not present

## 2020-05-16 DIAGNOSIS — H35341 Macular cyst, hole, or pseudohole, right eye: Secondary | ICD-10-CM | POA: Diagnosis not present

## 2020-05-31 ENCOUNTER — Ambulatory Visit
Admission: RE | Admit: 2020-05-31 | Discharge: 2020-05-31 | Disposition: A | Discharge status: Home / Self Care | Payer: Medicare Other | Source: Ambulatory Visit | Attending: Internal Medicine | Admitting: Internal Medicine

## 2020-05-31 ENCOUNTER — Other Ambulatory Visit: Payer: Self-pay

## 2020-05-31 DIAGNOSIS — Z1231 Encounter for screening mammogram for malignant neoplasm of breast: Secondary | ICD-10-CM | POA: Diagnosis not present

## 2020-06-01 ENCOUNTER — Other Ambulatory Visit: Payer: Self-pay | Admitting: Internal Medicine

## 2020-06-01 DIAGNOSIS — R928 Other abnormal and inconclusive findings on diagnostic imaging of breast: Secondary | ICD-10-CM

## 2020-06-22 ENCOUNTER — Inpatient Hospital Stay: Admission: RE | Admit: 2020-06-22 | Payer: Medicare Other | Source: Ambulatory Visit

## 2020-07-06 ENCOUNTER — Telehealth: Payer: Self-pay | Admitting: Internal Medicine

## 2020-07-06 DIAGNOSIS — I712 Thoracic aortic aneurysm, without rupture, unspecified: Secondary | ICD-10-CM

## 2020-07-06 NOTE — Telephone Encounter (Signed)
This RN returned patient's call, relayed the following from Dr. Margaretann Loveless:  Deanna Munroe, MD  [place], Belinda Block, RN Only because there is a report of a change in dimensions do I want to follow this a bit more closely for the next scan. If all appears stable, it can be spaced out again. Please make sure her CTA is a gated aorta study, lookslikeupcoming in July.   Patient asked when the CT would be scheduled, this RN to send to Dr. Delphina Cahill primary nurse to schedule.    Patient verbalized understanding. All questions/concerns addressed at this time.

## 2020-07-06 NOTE — Telephone Encounter (Signed)
Melaine is returning Deanna Velasquez's call from yesterday in regards to her CT results. Please advise.

## 2020-07-13 ENCOUNTER — Other Ambulatory Visit: Payer: Self-pay

## 2020-07-13 ENCOUNTER — Ambulatory Visit
Admission: RE | Admit: 2020-07-13 | Discharge: 2020-07-13 | Disposition: A | Discharge status: Home / Self Care | Payer: Medicare Other | Source: Ambulatory Visit | Attending: Internal Medicine | Admitting: Internal Medicine

## 2020-07-13 DIAGNOSIS — M8588 Other specified disorders of bone density and structure, other site: Secondary | ICD-10-CM | POA: Diagnosis not present

## 2020-07-13 DIAGNOSIS — M8580 Other specified disorders of bone density and structure, unspecified site: Secondary | ICD-10-CM

## 2020-07-13 DIAGNOSIS — Z780 Asymptomatic menopausal state: Secondary | ICD-10-CM | POA: Diagnosis not present

## 2020-07-19 NOTE — Telephone Encounter (Signed)
Patient had recent CTA Aorta done on March 04/12/20. Per Dr. Margaretann Loveless- would like to repeat this CTA Aorta as a gated study in 6 months which would be September of 2022.   Order placed for CTA Aorta Gated in September 2022- to be completed at [facility].   Returned call to patient, patient is aware of instructions and verbalized understanding.   Advised patient to call back to office with any issues, questions, or concerns. Patient verbalized understanding.

## 2020-08-03 ENCOUNTER — Ambulatory Visit
Admission: RE | Admit: 2020-08-03 | Discharge: 2020-08-03 | Disposition: A | Discharge status: Home / Self Care | Payer: Medicare Other | Source: Ambulatory Visit | Attending: Internal Medicine | Admitting: Internal Medicine

## 2020-08-03 ENCOUNTER — Other Ambulatory Visit: Payer: Self-pay

## 2020-08-03 DIAGNOSIS — Z803 Family history of malignant neoplasm of breast: Secondary | ICD-10-CM | POA: Diagnosis not present

## 2020-08-03 DIAGNOSIS — R928 Other abnormal and inconclusive findings on diagnostic imaging of breast: Secondary | ICD-10-CM | POA: Diagnosis not present

## 2020-08-03 DIAGNOSIS — R922 Inconclusive mammogram: Secondary | ICD-10-CM | POA: Diagnosis not present

## 2020-08-16 ENCOUNTER — Other Ambulatory Visit: Payer: Self-pay | Admitting: Internal Medicine

## 2020-08-16 DIAGNOSIS — R922 Inconclusive mammogram: Secondary | ICD-10-CM

## 2020-08-16 DIAGNOSIS — Z01812 Encounter for preprocedural laboratory examination: Secondary | ICD-10-CM

## 2020-08-22 NOTE — Telephone Encounter (Signed)
Elouise Munroe, MD  You 21 hours ago (1:18 PM)     Called patient. Pls scheduled GATED cta aorta for September and follow up with me after to discuss results.  GA    Will send message to CT scheduling department to get scheduled. Order has been placed already.

## 2020-08-22 NOTE — Telephone Encounter (Signed)
Gated CTA Aorta Scheduled for 10/11/20. BMET order placed for one week prior and message sent to patient.

## 2020-09-08 ENCOUNTER — Ambulatory Visit: Payer: Medicare Other

## 2020-09-11 ENCOUNTER — Other Ambulatory Visit: Payer: Self-pay

## 2020-09-11 ENCOUNTER — Ambulatory Visit
Admission: RE | Admit: 2020-09-11 | Discharge: 2020-09-11 | Disposition: A | Discharge status: Home / Self Care | Payer: No Typology Code available for payment source | Source: Ambulatory Visit | Attending: Internal Medicine | Admitting: Internal Medicine

## 2020-09-11 DIAGNOSIS — R922 Inconclusive mammogram: Secondary | ICD-10-CM

## 2020-09-11 MED ORDER — GADOBUTROL 1 MMOL/ML IV SOLN
6.0000 mL | Freq: Once | INTRAVENOUS | Status: AC | PRN
  Administered 2020-09-11: 6 mL via INTRAVENOUS

## 2020-10-11 ENCOUNTER — Other Ambulatory Visit: Payer: Self-pay

## 2020-10-11 ENCOUNTER — Ambulatory Visit (HOSPITAL_COMMUNITY)
Admission: RE | Admit: 2020-10-11 | Discharge: 2020-10-11 | Disposition: A | Discharge status: Home / Self Care | Payer: Medicare Other | Source: Ambulatory Visit | Attending: Internal Medicine | Admitting: Internal Medicine

## 2020-10-11 DIAGNOSIS — I712 Thoracic aortic aneurysm, without rupture, unspecified: Secondary | ICD-10-CM

## 2020-10-11 MED ORDER — IOHEXOL 350 MG/ML SOLN
100.0000 mL | Freq: Once | INTRAVENOUS | Status: AC | PRN
  Administered 2020-10-11: 100 mL via INTRAVENOUS

## 2020-10-15 ENCOUNTER — Other Ambulatory Visit: Payer: Self-pay

## 2020-10-15 DIAGNOSIS — I712 Thoracic aortic aneurysm, without rupture, unspecified: Secondary | ICD-10-CM

## 2020-11-02 DIAGNOSIS — H5203 Hypermetropia, bilateral: Secondary | ICD-10-CM | POA: Diagnosis not present

## 2020-11-02 DIAGNOSIS — H43393 Other vitreous opacities, bilateral: Secondary | ICD-10-CM | POA: Diagnosis not present

## 2020-11-02 DIAGNOSIS — H52223 Regular astigmatism, bilateral: Secondary | ICD-10-CM | POA: Diagnosis not present

## 2020-11-02 DIAGNOSIS — H524 Presbyopia: Secondary | ICD-10-CM | POA: Diagnosis not present

## 2020-11-02 DIAGNOSIS — H2513 Age-related nuclear cataract, bilateral: Secondary | ICD-10-CM | POA: Diagnosis not present

## 2020-11-26 ENCOUNTER — Other Ambulatory Visit: Payer: Medicare Other

## 2020-12-10 DIAGNOSIS — Z23 Encounter for immunization: Secondary | ICD-10-CM | POA: Diagnosis not present

## 2021-02-26 DIAGNOSIS — L720 Epidermal cyst: Secondary | ICD-10-CM | POA: Diagnosis not present

## 2021-02-26 DIAGNOSIS — D1801 Hemangioma of skin and subcutaneous tissue: Secondary | ICD-10-CM | POA: Diagnosis not present

## 2021-02-26 DIAGNOSIS — D225 Melanocytic nevi of trunk: Secondary | ICD-10-CM | POA: Diagnosis not present

## 2021-02-26 DIAGNOSIS — L814 Other melanin hyperpigmentation: Secondary | ICD-10-CM | POA: Diagnosis not present

## 2021-02-26 DIAGNOSIS — L308 Other specified dermatitis: Secondary | ICD-10-CM | POA: Diagnosis not present

## 2021-02-26 DIAGNOSIS — L578 Other skin changes due to chronic exposure to nonionizing radiation: Secondary | ICD-10-CM | POA: Diagnosis not present

## 2021-02-26 DIAGNOSIS — L570 Actinic keratosis: Secondary | ICD-10-CM | POA: Diagnosis not present

## 2021-03-11 DIAGNOSIS — I77810 Thoracic aortic ectasia: Secondary | ICD-10-CM | POA: Diagnosis not present

## 2021-03-11 DIAGNOSIS — M8580 Other specified disorders of bone density and structure, unspecified site: Secondary | ICD-10-CM | POA: Diagnosis not present

## 2021-03-11 DIAGNOSIS — F324 Major depressive disorder, single episode, in partial remission: Secondary | ICD-10-CM | POA: Diagnosis not present

## 2021-04-01 ENCOUNTER — Other Ambulatory Visit (HOSPITAL_COMMUNITY): Payer: Medicare Other

## 2021-04-02 ENCOUNTER — Ambulatory Visit (HOSPITAL_COMMUNITY): Payer: Medicare Other | Attending: Cardiovascular Disease

## 2021-04-02 ENCOUNTER — Other Ambulatory Visit: Payer: Self-pay

## 2021-04-02 DIAGNOSIS — I712 Thoracic aortic aneurysm, without rupture, unspecified: Secondary | ICD-10-CM | POA: Insufficient documentation

## 2021-04-02 LAB — ECHOCARDIOGRAM COMPLETE
Area-P 1/2: 3.3 cm2
P 1/2 time: 418 msec
S' Lateral: 2.7 cm

## 2021-07-01 DIAGNOSIS — N3281 Overactive bladder: Secondary | ICD-10-CM | POA: Diagnosis not present

## 2021-07-22 ENCOUNTER — Other Ambulatory Visit: Payer: Self-pay | Admitting: Internal Medicine

## 2021-07-22 DIAGNOSIS — Z1231 Encounter for screening mammogram for malignant neoplasm of breast: Secondary | ICD-10-CM

## 2021-08-06 ENCOUNTER — Ambulatory Visit
Admission: RE | Admit: 2021-08-06 | Discharge: 2021-08-06 | Disposition: A | Discharge status: Home / Self Care | Payer: Medicare Other | Source: Ambulatory Visit | Attending: Internal Medicine | Admitting: Internal Medicine

## 2021-08-06 DIAGNOSIS — Z1231 Encounter for screening mammogram for malignant neoplasm of breast: Secondary | ICD-10-CM

## 2021-08-07 DIAGNOSIS — H2511 Age-related nuclear cataract, right eye: Secondary | ICD-10-CM | POA: Diagnosis not present

## 2021-08-07 DIAGNOSIS — H25013 Cortical age-related cataract, bilateral: Secondary | ICD-10-CM | POA: Diagnosis not present

## 2021-08-07 DIAGNOSIS — H43391 Other vitreous opacities, right eye: Secondary | ICD-10-CM | POA: Diagnosis not present

## 2021-08-07 DIAGNOSIS — H35341 Macular cyst, hole, or pseudohole, right eye: Secondary | ICD-10-CM | POA: Diagnosis not present

## 2021-08-07 DIAGNOSIS — H43811 Vitreous degeneration, right eye: Secondary | ICD-10-CM | POA: Diagnosis not present

## 2021-08-07 DIAGNOSIS — H25043 Posterior subcapsular polar age-related cataract, bilateral: Secondary | ICD-10-CM | POA: Diagnosis not present

## 2021-08-07 DIAGNOSIS — H2513 Age-related nuclear cataract, bilateral: Secondary | ICD-10-CM | POA: Diagnosis not present

## 2021-10-07 ENCOUNTER — Other Ambulatory Visit: Payer: Self-pay

## 2021-10-07 ENCOUNTER — Encounter: Payer: Self-pay | Admitting: Internal Medicine

## 2021-10-07 DIAGNOSIS — I773 Arterial fibromuscular dysplasia: Secondary | ICD-10-CM

## 2021-10-07 DIAGNOSIS — I712 Thoracic aortic aneurysm, without rupture, unspecified: Secondary | ICD-10-CM

## 2021-10-08 LAB — BASIC METABOLIC PANEL
BUN/Creatinine Ratio: 11 — ABNORMAL LOW (ref 12–28)
BUN: 8 mg/dL (ref 8–27)
CO2: 22 mmol/L (ref 20–29)
Calcium: 9.7 mg/dL (ref 8.7–10.3)
Chloride: 99 mmol/L (ref 96–106)
Creatinine, Ser: 0.7 mg/dL (ref 0.57–1.00)
Glucose: 89 mg/dL (ref 70–99)
Potassium: 4.6 mmol/L (ref 3.5–5.2)
Sodium: 137 mmol/L (ref 134–144)
eGFR: 91 mL/min/{1.73_m2} (ref >59)

## 2021-10-09 ENCOUNTER — Ambulatory Visit (HOSPITAL_COMMUNITY)
Admission: RE | Admit: 2021-10-09 | Discharge: 2021-10-09 | Disposition: A | Discharge status: Home / Self Care | Payer: Medicare Other | Source: Ambulatory Visit | Attending: Internal Medicine | Admitting: Internal Medicine

## 2021-10-09 DIAGNOSIS — Z23 Encounter for immunization: Secondary | ICD-10-CM | POA: Diagnosis not present

## 2021-10-09 DIAGNOSIS — I700 Atherosclerosis of aorta: Secondary | ICD-10-CM | POA: Diagnosis not present

## 2021-10-09 DIAGNOSIS — I712 Thoracic aortic aneurysm, without rupture, unspecified: Secondary | ICD-10-CM | POA: Insufficient documentation

## 2021-10-09 MED ORDER — IOHEXOL 350 MG/ML SOLN
100.0000 mL | Freq: Once | INTRAVENOUS | Status: AC | PRN
  Administered 2021-10-09: 100 mL via INTRAVENOUS

## 2021-10-15 DIAGNOSIS — H2511 Age-related nuclear cataract, right eye: Secondary | ICD-10-CM | POA: Diagnosis not present

## 2021-10-15 DIAGNOSIS — H269 Unspecified cataract: Secondary | ICD-10-CM | POA: Diagnosis not present

## 2021-10-15 DIAGNOSIS — H2512 Age-related nuclear cataract, left eye: Secondary | ICD-10-CM | POA: Diagnosis not present

## 2021-10-29 DIAGNOSIS — Z9841 Cataract extraction status, right eye: Secondary | ICD-10-CM | POA: Diagnosis not present

## 2021-10-29 DIAGNOSIS — H52221 Regular astigmatism, right eye: Secondary | ICD-10-CM | POA: Diagnosis not present

## 2021-10-29 DIAGNOSIS — H2512 Age-related nuclear cataract, left eye: Secondary | ICD-10-CM | POA: Diagnosis not present

## 2021-10-29 DIAGNOSIS — H5211 Myopia, right eye: Secondary | ICD-10-CM | POA: Diagnosis not present

## 2021-10-29 DIAGNOSIS — Z961 Presence of intraocular lens: Secondary | ICD-10-CM | POA: Diagnosis not present

## 2021-10-29 DIAGNOSIS — H2511 Age-related nuclear cataract, right eye: Secondary | ICD-10-CM | POA: Diagnosis not present

## 2021-11-04 DIAGNOSIS — Z23 Encounter for immunization: Secondary | ICD-10-CM | POA: Diagnosis not present

## 2021-11-05 ENCOUNTER — Ambulatory Visit: Payer: Medicare Other | Attending: Cardiovascular Disease | Admitting: Cardiovascular Disease

## 2021-11-05 ENCOUNTER — Encounter: Payer: Self-pay | Admitting: Cardiovascular Disease

## 2021-11-05 VITALS — BP 100/60 | HR 74 | Ht 67.0 in | Wt 130.0 lb

## 2021-11-05 DIAGNOSIS — I773 Arterial fibromuscular dysplasia: Secondary | ICD-10-CM | POA: Diagnosis not present

## 2021-11-05 DIAGNOSIS — I77810 Thoracic aortic ectasia: Secondary | ICD-10-CM | POA: Diagnosis not present

## 2021-11-05 NOTE — Patient Instructions (Signed)
Medication Instructions:  Your physician recommends that you continue on your current medications as directed. Please refer to the Current Medication list given to you today.  *If you need a refill on your cardiac medications before your next appointment, please call your pharmacy*  Lab Work: NONE ordered at this time of appointment   If you have labs (blood work) drawn today and your tests are completely normal, you will receive your results only by: [place] (if you have MyChart) OR A paper copy in the mail If you have any lab test that is abnormal or we need to change your treatment, we will call you to review the results.  Testing/Procedures: Your physician has requested that you have a carotid duplex. This test is an ultrasound of the carotid arteries in your neck. It looks at blood flow through these arteries that supply the brain with blood. Allow one hour for this exam. There are no restrictions or special instructions.  Follow-Up: At [facility], you and your health needs are our priority.  As part of our continuing mission to provide you with exceptional heart care, we have created designated Provider Care Teams.  These Care Teams include your primary Cardiologist (physician) and Advanced Practice Providers (APPs -  Physician Assistants and Nurse Practitioners) who all work together to provide you with the care you need, when you need it.   Your next appointment:   1 year(s)  The format for your next appointment:   In Person  Provider:   Kathlyn Sacramento, MD     Other Instructions   Important Information About [place]

## 2021-11-05 NOTE — Progress Notes (Signed)
Cardiology Office Note   Date:  11/05/2021   ID:  Kimimila, Tauzin Jan 27, 1951, MRN [id number]  PCP:  Seward Carol, MD  Cardiologist:  Dr. Margaretann Loveless  No chief complaint on file.     History of Present Illness: LEIGHANNE ADOLPH is a 71 y.o. female who was referred by Dr. Margaretann Loveless for evaluation and management of fibromuscular dysplasia. She has known history of thoracic aortic aneurysm measuring 42 mm, GERD and irritable bowel syndrome. She is a previous smoker and quit in 04/27/1987.  Her mother died at the age of 73 of a ruptured aortic aneurysm. She underwent an echocardiogram in 04/27/2022 of this year which showed normal LV systolic function with mild aortic regurgitation. She had a follow-up CTA of the aorta last month which showed stable size ascending aortic aneurysm measuring 42 mm.  There was an incidental finding of fibromuscular dysplasia affecting the right renal artery. The patient has no prior history of documented fibromuscular dysplasia.  She has no history of hypertension or chronic kidney disease.  She denies any postprandial abdominal pain and there is no weight loss.  No previous history of stroke.  She denies any chest pain or worsening dyspnea.  Past Medical History:  Diagnosis Date   ADHD    Cataracts, bilateral    Depression    GERD (gastroesophageal reflux disease)    IBS (irritable bowel syndrome)    No pertinent past medical history     Past Surgical History:  Procedure Laterality Date   BREAST EXCISIONAL BIOPSY Right    benign   BREAST LUMPECTOMY Right Apr 26, 2001   benign   BREAST LUMPECTOMY Right 27-Apr-2011   BREAST LUMPECTOMY WITH NEEDLE LOCALIZATION  12/29/2011   Procedure: BREAST LUMPECTOMY WITH NEEDLE LOCALIZATION;  Surgeon: Haywood Lasso, MD;  Location: [facility];  Service: General;  Laterality: Right;  Needle Localized Removal Right Breast Mass   KNEE ARTHROSCOPY  06/1995   Dr. Nicholaus Bloom   LAPAROSCOPY  04/1983   PILONIDAL CYST EXCISION   1969   Dr. Vida Rigger   TONSILECTOMY/ADENOIDECTOMY WITH MYRINGOTOMY  12/1953     Current Outpatient Medications  Medication Sig Dispense Refill   b complex vitamins tablet Take 1 tablet by mouth daily.     buPROPion (WELLBUTRIN XL) 150 MG 24 hr tablet Take 150 mg by mouth daily.     Coenzyme Q10 (COQ10) 100 MG CAPS Take 1 capsule by mouth daily.      Cyanocobalamin (VITAMIN B-12) 3000 MCG SUBL Place 2-3 tablets under the tongue daily as needed.      oxybutynin (DITROPAN) 5 MG tablet Take 5 mg by mouth 2 (two) times daily.     No current facility-administered medications for this visit.    Allergies:   Paxil [paroxetine hcl], Prozac [fluoxetine hcl], Sertraline hcl, and Tape    Social History:  The patient  reports that she quit smoking about 41 years ago. Her smoking use included cigarettes. She has never used smokeless tobacco. She reports current alcohol use. She reports that she does not use drugs.   Family History:  The patient's family history includes Breast cancer in her maternal aunt and mother; Cancer in her maternal aunt and mother; Congestive Heart Failure in her sister and sister; Diabetes in her sister; Stroke in her father; Tuberculosis in her maternal grandfather.    ROS:  Please see the history of present illness.   Otherwise, review of systems are positive for none.   All other systems  are reviewed and negative.    PHYSICAL EXAM: VS:  BP 100/60   Pulse 74   Ht '5\' 7"'$  (1.702 m)   Wt 130 lb (59 kg)   LMP  (LMP Unknown)   SpO2 99%   BMI 20.36 kg/m  , BMI Body mass index is 20.36 kg/m. GEN: Well nourished, well developed, in no acute distress  HEENT: normal  Neck: no JVD, carotid bruits, or masses Cardiac: RRR; no murmurs, rubs, or gallops,no edema  Respiratory:  clear to auscultation bilaterally, normal work of breathing GI: soft, nontender, nondistended, + BS MS: no deformity or atrophy  Skin: warm and dry, no rash Neuro:  Strength and sensation are  intact Psych: euthymic mood, full affect   EKG:  EKG is ordered today. The ekg ordered today demonstrates normal sinus rhythm with no significant ST or T wave changes.   Recent Labs: 10/07/2021: BUN 8; Creatinine, Ser 0.70; Potassium 4.6; Sodium 137    Lipid Panel    Component Value Date/Time   CHOL 178 04/09/2020 1434   TRIG 119 04/09/2020 1434   HDL 66 04/09/2020 1434   CHOLHDL 2.7 04/09/2020 1434   LDLCALC 91 04/09/2020 1434      Wt Readings from Last 3 Encounters:  11/05/21 130 lb (59 kg)  04/03/20 134 lb (60.8 kg)  05/18/19 136 lb (61.7 kg)         03/24/2018    9:31 AM  PAD Screen  Previous PAD dx? No  Previous surgical procedure? No  Pain with walking? No  Feet/toe relief with dangling? No  Painful, non-healing ulcers? No  Extremities discolored? No      ASSESSMENT AND PLAN:  1.  Fibromuscular dysplasia of the renal artery: The changes on the CT scan are overall mild with no evidence of obstructive physiology.  Thus, there is no indication for renal artery angiography and angioplasty at the present time.  She has no history of hypertension and no chronic kidney disease.  This seems to be an incidental finding at the present time.  I did discuss with her the pathophysiology of fibromuscular dysplasia and the tendency to affect other medium sized vessels such as the mesenteric artery and the carotid arteries.  She does not have symptoms of chronic mesenteric ischemia.  I did request carotid Doppler to ensure no carotid involvement. Otherwise, no intervention is needed at the present time.  2.  Ascending aortic aneurysm: This has been stable in size on most recent CT scan.  I agree with annual imaging.    Disposition:   FU with me in 1 year Signed,  Kathlyn Sacramento, MD  11/05/2021 11:35 AM    [place]

## 2021-11-21 ENCOUNTER — Ambulatory Visit (HOSPITAL_COMMUNITY)
Admission: RE | Admit: 2021-11-21 | Discharge: 2021-11-21 | Disposition: A | Discharge status: Home / Self Care | Payer: Medicare Other | Source: Ambulatory Visit | Attending: Cardiovascular Disease | Admitting: Cardiovascular Disease

## 2021-11-21 DIAGNOSIS — I773 Arterial fibromuscular dysplasia: Secondary | ICD-10-CM | POA: Diagnosis not present

## 2022-01-24 DIAGNOSIS — H15101 Unspecified episcleritis, right eye: Secondary | ICD-10-CM | POA: Diagnosis not present

## 2022-03-03 DIAGNOSIS — H35341 Macular cyst, hole, or pseudohole, right eye: Secondary | ICD-10-CM | POA: Diagnosis not present

## 2022-03-03 DIAGNOSIS — H43811 Vitreous degeneration, right eye: Secondary | ICD-10-CM | POA: Diagnosis not present

## 2022-03-03 DIAGNOSIS — H2512 Age-related nuclear cataract, left eye: Secondary | ICD-10-CM | POA: Diagnosis not present

## 2022-03-03 DIAGNOSIS — H26491 Other secondary cataract, right eye: Secondary | ICD-10-CM | POA: Diagnosis not present

## 2022-03-03 DIAGNOSIS — H43391 Other vitreous opacities, right eye: Secondary | ICD-10-CM | POA: Diagnosis not present

## 2022-03-13 DIAGNOSIS — H5203 Hypermetropia, bilateral: Secondary | ICD-10-CM | POA: Diagnosis not present

## 2022-03-13 DIAGNOSIS — Z961 Presence of intraocular lens: Secondary | ICD-10-CM | POA: Diagnosis not present

## 2022-03-13 DIAGNOSIS — H52223 Regular astigmatism, bilateral: Secondary | ICD-10-CM | POA: Diagnosis not present

## 2022-03-13 DIAGNOSIS — Z9841 Cataract extraction status, right eye: Secondary | ICD-10-CM | POA: Diagnosis not present

## 2022-03-13 DIAGNOSIS — H524 Presbyopia: Secondary | ICD-10-CM | POA: Diagnosis not present

## 2022-03-20 DIAGNOSIS — D225 Melanocytic nevi of trunk: Secondary | ICD-10-CM | POA: Diagnosis not present

## 2022-03-20 DIAGNOSIS — L821 Other seborrheic keratosis: Secondary | ICD-10-CM | POA: Diagnosis not present

## 2022-03-20 DIAGNOSIS — D1801 Hemangioma of skin and subcutaneous tissue: Secondary | ICD-10-CM | POA: Diagnosis not present

## 2022-03-20 DIAGNOSIS — L649 Androgenic alopecia, unspecified: Secondary | ICD-10-CM | POA: Diagnosis not present

## 2022-03-20 DIAGNOSIS — L570 Actinic keratosis: Secondary | ICD-10-CM | POA: Diagnosis not present

## 2022-04-06 ENCOUNTER — Encounter (HOSPITAL_BASED_OUTPATIENT_CLINIC_OR_DEPARTMENT_OTHER): Payer: Self-pay | Admitting: Emergency Medicine

## 2022-04-06 ENCOUNTER — Other Ambulatory Visit: Payer: Self-pay

## 2022-04-06 ENCOUNTER — Emergency Department (HOSPITAL_BASED_OUTPATIENT_CLINIC_OR_DEPARTMENT_OTHER)
Admission: EM | Admit: 2022-04-06 | Discharge: 2022-04-06 | Disposition: A | Discharge status: Home / Self Care | Payer: Medicare Other | Attending: Emergency Medicine | Admitting: Emergency Medicine

## 2022-04-06 DIAGNOSIS — U071 COVID-19: Secondary | ICD-10-CM | POA: Insufficient documentation

## 2022-04-06 DIAGNOSIS — R000 Tachycardia, unspecified: Secondary | ICD-10-CM | POA: Insufficient documentation

## 2022-04-06 DIAGNOSIS — R509 Fever, unspecified: Secondary | ICD-10-CM | POA: Diagnosis present

## 2022-04-06 HISTORY — DX: Aortic aneurysm of unspecified site, without rupture: I71.9

## 2022-04-06 LAB — CBC WITH DIFFERENTIAL/PLATELET
Abs Immature Granulocytes: 0.01 10*3/uL (ref 0.00–0.07)
Basophils Absolute: 0 10*3/uL (ref 0.0–0.1)
Basophils Relative: 1 %
Eosinophils Absolute: 0 10*3/uL (ref 0.0–0.5)
Eosinophils Relative: 0 %
HCT: 37.9 % (ref 36.0–46.0)
Hemoglobin: 12.9 g/dL (ref 12.0–15.0)
Immature Granulocytes: 0 %
Lymphocytes Relative: 8 %
Lymphs Abs: 0.5 10*3/uL — ABNORMAL LOW (ref 0.7–4.0)
MCH: 30.9 pg (ref 26.0–34.0)
MCHC: 34 g/dL (ref 30.0–36.0)
MCV: 90.7 fL (ref 80.0–100.0)
Monocytes Absolute: 0.8 10*3/uL (ref 0.1–1.0)
Monocytes Relative: 14 %
Neutro Abs: 4.7 10*3/uL (ref 1.7–7.7)
Neutrophils Relative %: 77 %
Platelets: 249 10*3/uL (ref 150–400)
RBC: 4.18 MIL/uL (ref 3.87–5.11)
RDW: 13 % (ref 11.5–15.5)
WBC: 6.1 10*3/uL (ref 4.0–10.5)
nRBC: 0 % (ref 0.0–0.2)

## 2022-04-06 LAB — BASIC METABOLIC PANEL
Anion gap: 9 (ref 5–15)
BUN: 5 mg/dL — ABNORMAL LOW (ref 8–23)
CO2: 23 mmol/L (ref 22–32)
Calcium: 9.3 mg/dL (ref 8.9–10.3)
Chloride: 100 mmol/L (ref 98–111)
Creatinine, Ser: 0.78 mg/dL (ref 0.44–1.00)
GFR, Estimated: >60 mL/min (ref >60)
Glucose, Bld: 109 mg/dL — ABNORMAL HIGH (ref 70–99)
Potassium: 3.9 mmol/L (ref 3.5–5.1)
Sodium: 132 mmol/L — ABNORMAL LOW (ref 135–145)

## 2022-04-06 NOTE — ED Triage Notes (Signed)
Positive for covid today, symptoms started last night the patient is interested in antiviral.

## 2022-04-06 NOTE — ED Provider Notes (Signed)
[facility] Provider Note   CSN: [id number] Arrival date & time: 04/06/22  1526     History  No chief complaint on file.   Deanna Velasquez is a 72 y.o. female.  The history is provided by the patient and medical records. No language interpreter was used.     72 year old female sent in history of aortic aneurysm, GERD, depression, ADHD, IBS, presenting requesting for treatment for COVID infection.  Patient reports since yesterday afternoon she has developed fever as high as 101, chills, body aches, congestion, sore throat, cough, and decrease in appetite.  She also endorsed having bouts of nausea as well.  She had a positive home COVID test.  She is here today requesting to be treated with antiviral medication.  Patient mention this is the first time having COVID.  She has been in [employer] study and has had extensive vaccination and booster shots.  She denies any recent sick contact.  No shortness of breath.  Home Medications Prior to Admission medications   Medication Sig Start Date End Date Taking? Authorizing Provider  b complex vitamins tablet Take 1 tablet by mouth daily.    [provider]  buPROPion (WELLBUTRIN XL) 150 MG 24 hr tablet Take 150 mg by mouth daily. 07/18/19   [provider]  Coenzyme Q10 (COQ10) 100 MG CAPS Take 1 capsule by mouth daily.     [provider]  Cyanocobalamin (VITAMIN B-12) 3000 MCG SUBL Place 2-3 tablets under the tongue daily as needed.     [provider]  oxybutynin (DITROPAN) 5 MG tablet Take 5 mg by mouth 2 (two) times daily. 10/07/21   [provider]      Allergies    Paxil [paroxetine hcl], Prozac [fluoxetine hcl], Sertraline hcl, and Tape    Review of Systems   Review of Systems  All other systems reviewed and are negative.   Physical Exam Updated Vital Signs BP 126/86 (BP Location: Right Arm)   Pulse (!) 115   Temp 98.2 F (36.8 C)   Resp  20   LMP  (LMP Unknown)   SpO2 100%  Physical Exam Vitals and nursing note reviewed.  Constitutional:      General: She is not in acute distress.    Appearance: She is well-developed.  HENT:     Head: Atraumatic.  Eyes:     Conjunctiva/sclera: Conjunctivae normal.  Cardiovascular:     Rate and Rhythm: Tachycardia present.  Pulmonary:     Effort: Pulmonary effort is normal.     Breath sounds: No wheezing, rhonchi or rales.  Abdominal:     Palpations: Abdomen is soft.     Tenderness: There is no abdominal tenderness.  Musculoskeletal:     Cervical back: Neck supple.  Skin:    Findings: No rash.  Neurological:     Mental Status: She is alert.  Psychiatric:        Mood and Affect: Mood normal.     ED Results / Procedures / Treatments   Labs (all labs ordered are listed, but only abnormal results are displayed) Labs Reviewed  BASIC METABOLIC PANEL - Abnormal; Notable for the following components:      Result Value   Sodium 132 (*)    Glucose, Bld 109 (*)    BUN 5 (*)    All other components within normal limits  CBC WITH DIFFERENTIAL/PLATELET - Abnormal; Notable for the following components:   Lymphs Abs 0.5 (*)  All other components within normal limits    EKG None  Radiology No results found.  Procedures Procedures    Medications Ordered in ED Medications - No data to display  ED Course/ Medical Decision Making/ A&P                             Medical Decision Making Amount and/or Complexity of Data Reviewed Labs: ordered.   BP 126/86 (BP Location: Right Arm)   Pulse (!) 115   Temp 98.2 F (36.8 C)   Resp 20   LMP  (LMP Unknown)   SpO2 100%   72:75 PM  72 year old female sent in history of aortic aneurysm, GERD, depression, ADHD, IBS, presenting requesting for treatment for COVID infection.  Patient reports since yesterday afternoon she has developed fever as high as 101, chills, body aches, congestion, sore throat, cough, and decrease in  appetite.  She also endorsed having bouts of nausea as well.  She had a positive home COVID test.  She is here today requesting to be treated with antiviral medication.  Patient mention this is the first time having COVID.  She has been in [employer] study and has had extensive vaccination and booster shots.  She denies any recent sick contact.  No shortness of breath.  On exam this is a well-appearing elderly female resting comfortably in bed appears to be in no acute discomfort.  Heart with mild tachycardia, lungs are clear to auscultation bilaterally abdomen is soft nontender, no evidence of peripheral edema.  She is moving all 4 extremities with difficulty.  She is mentating appropriately.  Vital signs reviewed and remarkable for mild tachycardia with heart rate of 115.  She is afebrile, no hypoxia.  Offered patient IV fluid and patient declined.  Since patient requesting for Paxlovid and she qualifies due to her age and underlying illness, I will check her renal function to determine the appropriate dosage.  At this time patient is that her nausea has improved.  5:18 PM Labs obtained independently viewed interpreted by me and overall reassuring.  No significant electrolyte derangement.  Normal WBC, normal H&H.  Although I felt patient certainly can take Paxlovid, I felt patient is overall well appearing and I also discussed benefit and risks of the medication which include side effects from nausea to diarrhea.  At this time patient felt comfortable not starting the antiviral medication but understands to return if her symptoms worsen.  Recommend supportive care, care discussed with Dr. Maryan Rued.        Final Clinical Impression(s) / ED Diagnoses Final diagnoses:  COVID-19 virus infection    Rx / DC Orders ED Discharge Orders     None         Domenic Moras, PA-C 04/06/22 1721    Blanchie Dessert, MD 04/09/22 [phone]

## 2022-04-06 NOTE — ED Notes (Signed)
Patient given discharge instructions. Questions were answered. Patient verbalized understanding of discharge instructions and care at home.  

## 2022-04-06 NOTE — Discharge Instructions (Signed)
Recommendations for at home COVID-19 symptoms management:  Please continue isolation at home. If have acute worsening of symptoms please go to ER/urgent care for further evaluation. Check pulse oximetry and if below 90-92% please go to ER. The following supplements MAY help:  Vitamin C 500mg twice a day and Quercetin 250-500 mg twice a day Vitamin D3 2000 - 4000 u/day B Complex vitamins Zinc 75-100 mg/day Melatonin 6-10 mg at night (the optimal dose is unknown) Aspirin 81mg/day (if no history of bleeding issues)  

## 2022-04-16 DIAGNOSIS — J069 Acute upper respiratory infection, unspecified: Secondary | ICD-10-CM | POA: Diagnosis not present

## 2022-04-16 DIAGNOSIS — Z8616 Personal history of COVID-19: Secondary | ICD-10-CM | POA: Diagnosis not present

## 2022-04-18 IMAGING — CT CT ANGIO CHEST
3 of 6 series · 16 of 46 positions shown · IV contrast (omnipaque)
Comparison: 05/16/2019

CLINICAL DATA: Follow-up thoracic aortic aneurysm.

EXAM:
CT ANGIOGRAPHY CHEST WITH CONTRAST
TECHNIQUE: Multidetector CT imaging of the chest was performed using the
standard protocol during bolus administration of intravenous
contrast. Multiplanar CT image reconstructions and MIPs were
obtained to evaluate the vascular anatomy.
CONTRAST:  100mL OMNIPAQUE IOHEXOL 350 MG/ML SOLN

[Series 4: axial arterial · axial · arterial · 0.70mm/px · z∈[-137,+130]mm · 11 of 107 slices shown]
[im 9/107  lung]
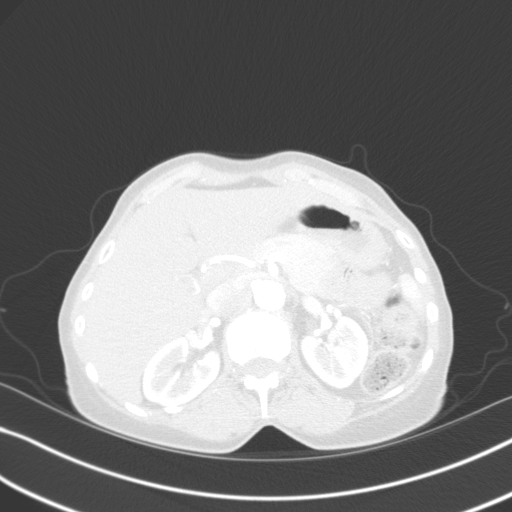
[im 18/107  soft-tissue]
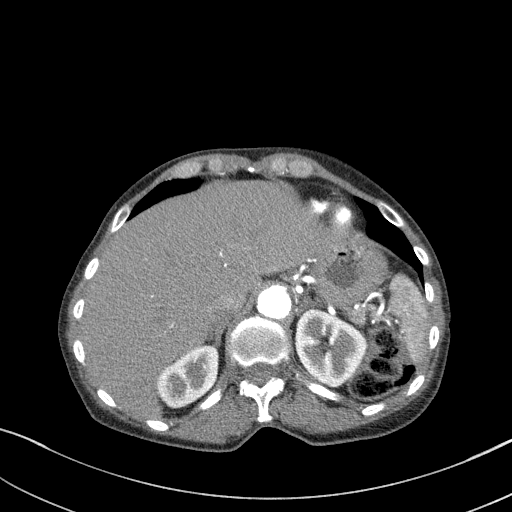
[im 27/107  lung]
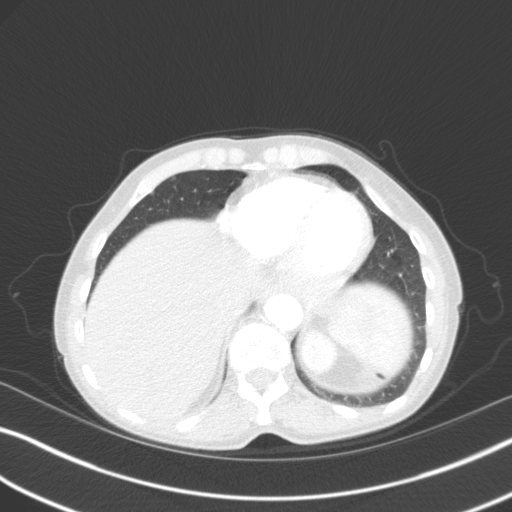
[im 36/107  soft-tissue]
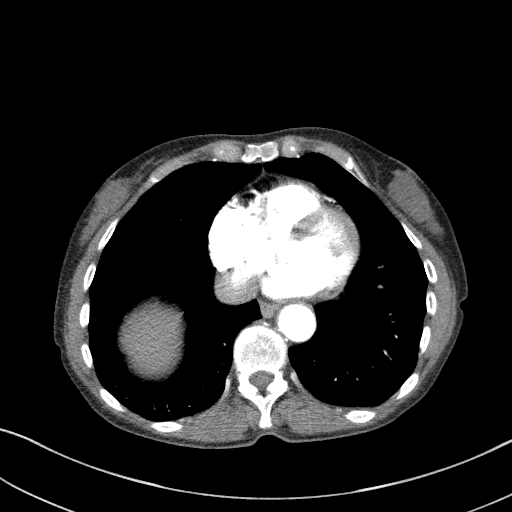
[im 45/107  lung]
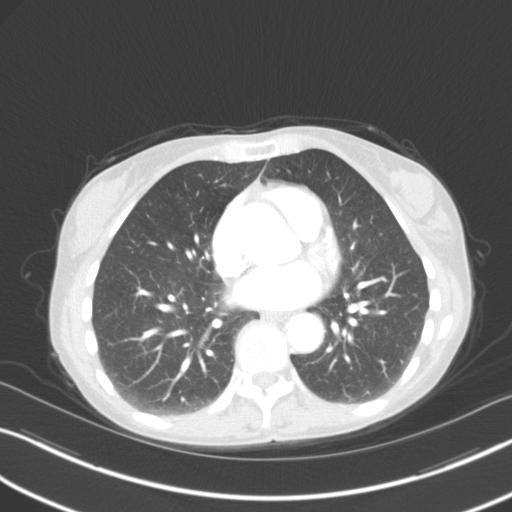
[im 54/107  soft-tissue]
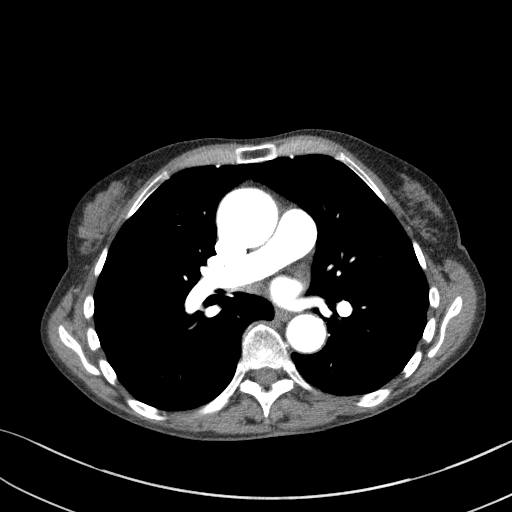
[im 62/107  lung]
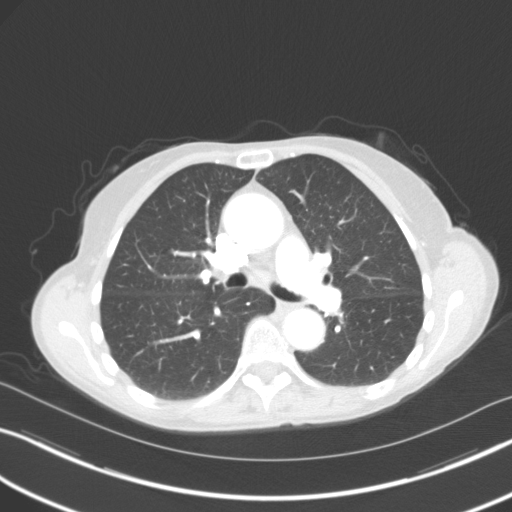
[im 71/107  soft-tissue]
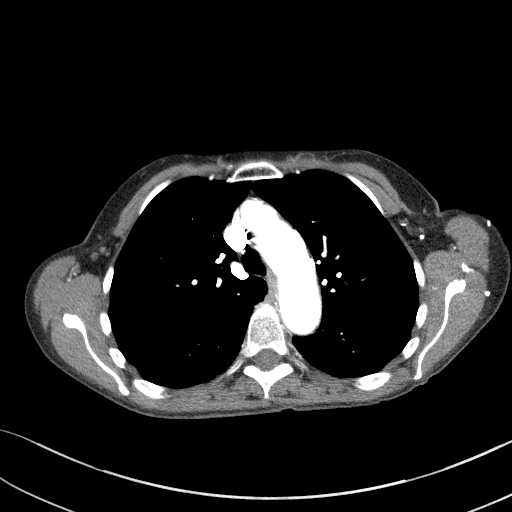
[im 80/107  lung]
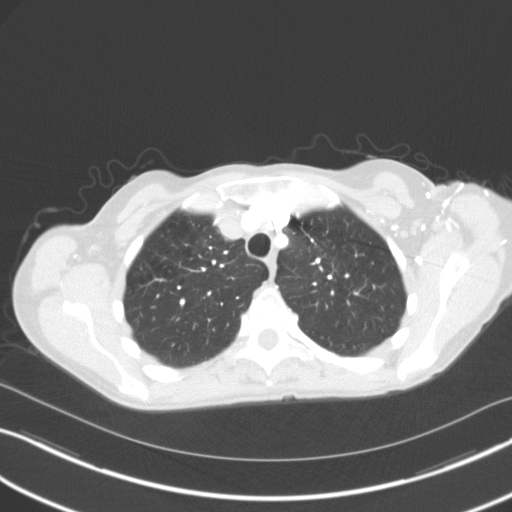
[im 89/107  soft-tissue]
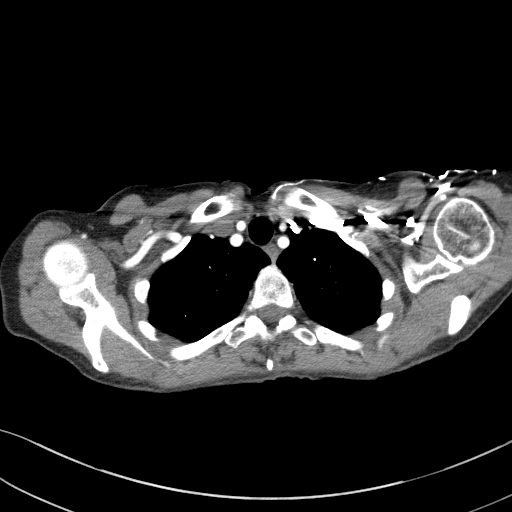
[im 98/107  lung]
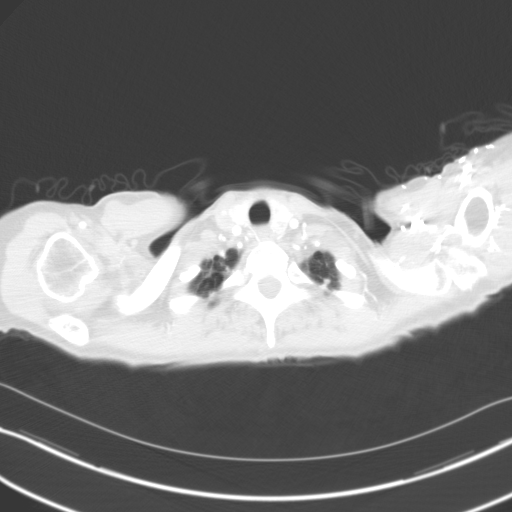

[Series 6: lung · axial · 0.70mm/px · z∈[-129,-93]mm · 2 of 161 slices shown]
[im 18/161  soft-tissue]
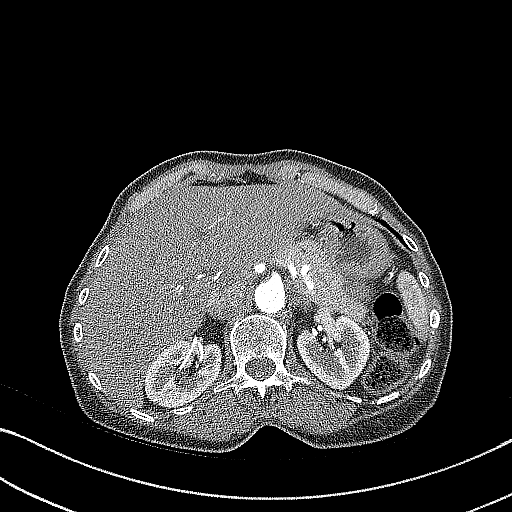
[im 36/161  soft-tissue]
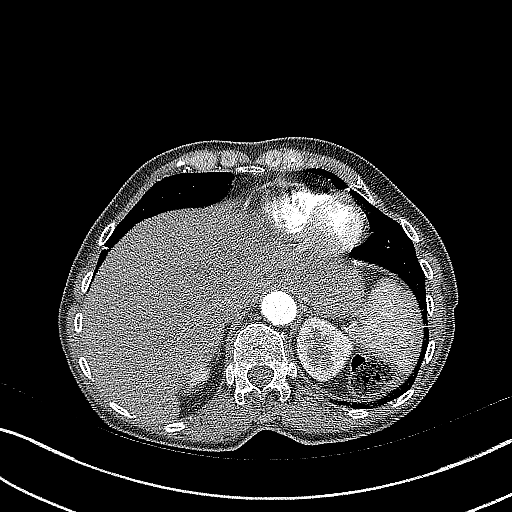

[Series 7: coronal · coronal · 0.65mm/px · 3 of 79 slices shown]
[im 20/79  soft-tissue]
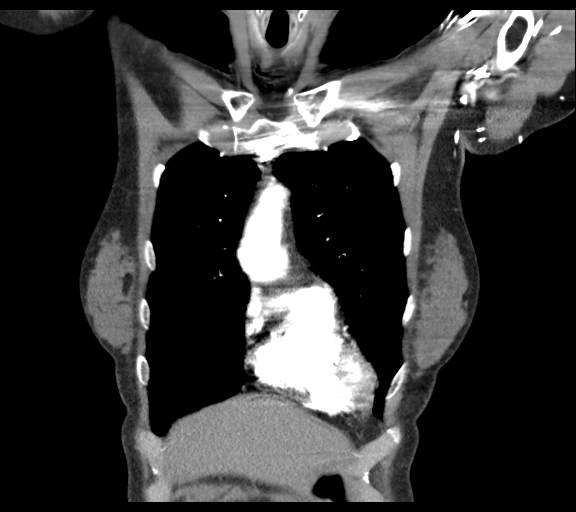
[im 40/79  soft-tissue]
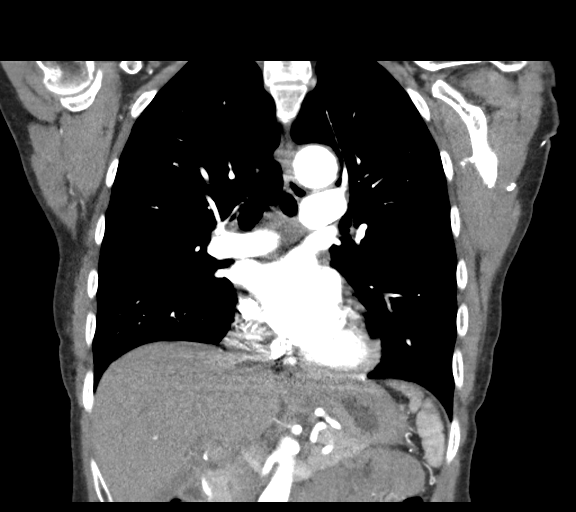
[im 59/79  soft-tissue]
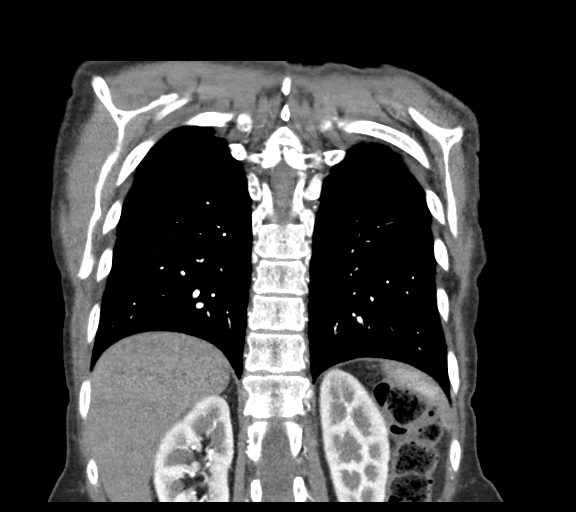

[16 of 46 positions shown; findings below may reference images not displayed]

FINDINGS: Vascular Findings:

Stable fusiform aneurysmal dilatation of the ascending thoracic
aorta with measurements as follows.

There is a moderate amount eccentric calcified atherosclerotic
plaque involving the aortic arch and descending thoracic aorta, not
resulting in a hemodynamically significant stenosis. The descending
thoracic aorta is normal caliber and widely patent. No evidence of
thoracic aortic dissection or periaortic stranding on this non gated
examination.

Conventional configuration of the aortic arch. The branch vessels of
the aortic arch appear widely patent throughout their imaged
courses.

Normal heart size. No pericardial effusion. Although this
examination was not tailored for the evaluation the pulmonary
arteries, there are no discrete filling defects within the central
pulmonary arterial tree to suggest central pulmonary embolism.
Normal caliber of the main pulmonary artery.

-------------------------------------------------------------

Thoracic aortic measurements:

SINOTUBULAR JUNCTION: 39 mm as measured in greatest oblique short
axis coronal dimension.

PROXIMAL ASCENDING THORACIC AORTA: 45 as measured in greatest
oblique short axis axial dimension at the level of the main
pulmonary artery and approximately 43 mm in greatest oblique short
axis coronal diameter (image 26, series 7), unchanged compared to
the 05/16/2019 examination by my direct remeasurement.

AORTIC ARCH: 20 mm as measured in greatest oblique short axis
sagittal dimension.

PROXIMAL DESCENDING THORACIC AORTA: 27 mm as measured in greatest
oblique short axis axial dimension at the level of the main
pulmonary artery.

DISTAL DESCENDING THORACIC AORTA: 24 mm as measured in greatest
oblique short axis axial dimension at the level of the diaphragmatic
hiatus.

Review of the MIP images confirms the above findings.

-------------------------------------------------------------

Non-Vascular Findings:

Mediastinum/Lymph Nodes: No bulky mediastinal, hilar or axillary
lymphadenopathy.

Lungs/Pleura: Grossly unchanged minimal biapical parenchymal
thickening minimal dependent subpleural ground-glass atelectasis. No
discrete focal airspace opacities. No pleural effusion or
pneumothorax. Central pulmonary airways appear widely patent.

No new or enlarging pulmonary nodules.

Punctate (3 mm) subpleural nodule within the right middle lobe
(image 119, series 6), is grossly unchanged in size compared to the
[DATE] examination with slight differences attributable to image
slice selection. Additional pulmonary nodules are also unchanged
compared to the [DATE] examination dominant punctate (4 mm) left
lower lobe seen image 124, series 6.

Additional punctate (sub 3 mm) right-sided pulmonary nodules are
seen on images 52, 98 and 122, series 6.

Upper abdomen: Limited early arterial phase evaluation of the upper
abdomen is normal.

Musculoskeletal: No acute or aggressive osseous abnormalities.
Heterogeneity of the thyroid parenchyma including suspected punctate
(subcentimeter) hypoattenuating thyroid nodules (image 9, series 4).
Regional soft tissues appear normal.
IMPRESSION: 1. Stable uncomplicated fusiform aneurysmal dilatation of the
ascending thoracic aorta measuring 45 mm in diameter, unchanged
compared to the [DATE] examination by my direct remeasurement.
Ascending thoracic aortic aneurysm. Recommend semi-annual imaging
followup by CTA or MRA and referral to cardiothoracic surgery if not
already obtained. This recommendation follows 5656
ACCF/AHA/AATS/ACR/ASA/SCA/KIDISHA/PAIS/MODRITCH/AZERETH Guidelines for the
Diagnosis and Management of Patients With Thoracic Aortic Disease.
Circulation. 5656; 121: E266-e369. Aortic aneurysm NOS (Q95HI-MUO.S)
2.  Aortic Atherosclerosis (Q95HI-M83.3).
3. No new or enlarging pulmonary nodules.
4. Punctate bilateral pulmonary nodules are unchanged compared to
ensure 2 years of stability and thus a benign etiology.

## 2022-08-05 DIAGNOSIS — R413 Other amnesia: Secondary | ICD-10-CM | POA: Diagnosis not present

## 2022-08-05 DIAGNOSIS — M8580 Other specified disorders of bone density and structure, unspecified site: Secondary | ICD-10-CM | POA: Diagnosis not present

## 2022-08-05 DIAGNOSIS — E538 Deficiency of other specified B group vitamins: Secondary | ICD-10-CM | POA: Diagnosis not present

## 2022-08-05 DIAGNOSIS — F324 Major depressive disorder, single episode, in partial remission: Secondary | ICD-10-CM | POA: Diagnosis not present

## 2022-08-05 DIAGNOSIS — Z79899 Other long term (current) drug therapy: Secondary | ICD-10-CM | POA: Diagnosis not present

## 2022-08-05 DIAGNOSIS — I351 Nonrheumatic aortic (valve) insufficiency: Secondary | ICD-10-CM | POA: Diagnosis not present

## 2022-08-05 DIAGNOSIS — E7800 Pure hypercholesterolemia, unspecified: Secondary | ICD-10-CM | POA: Diagnosis not present

## 2022-08-05 DIAGNOSIS — I77810 Thoracic aortic ectasia: Secondary | ICD-10-CM | POA: Diagnosis not present

## 2022-08-05 DIAGNOSIS — Z0000 Encounter for general adult medical examination without abnormal findings: Secondary | ICD-10-CM | POA: Diagnosis not present

## 2022-08-12 ENCOUNTER — Other Ambulatory Visit: Payer: Self-pay | Admitting: Internal Medicine

## 2022-08-12 DIAGNOSIS — Z1231 Encounter for screening mammogram for malignant neoplasm of breast: Secondary | ICD-10-CM

## 2022-08-22 ENCOUNTER — Ambulatory Visit
Admission: RE | Admit: 2022-08-22 | Discharge: 2022-08-22 | Disposition: A | Discharge status: Home / Self Care | Payer: Medicare Other | Source: Ambulatory Visit

## 2022-08-22 DIAGNOSIS — Z1231 Encounter for screening mammogram for malignant neoplasm of breast: Secondary | ICD-10-CM

## 2022-08-25 ENCOUNTER — Other Ambulatory Visit: Payer: Self-pay | Admitting: Internal Medicine

## 2022-08-25 DIAGNOSIS — M8580 Other specified disorders of bone density and structure, unspecified site: Secondary | ICD-10-CM

## 2022-10-07 ENCOUNTER — Other Ambulatory Visit: Payer: Self-pay | Admitting: Internal Medicine

## 2022-10-07 DIAGNOSIS — Z1231 Encounter for screening mammogram for malignant neoplasm of breast: Secondary | ICD-10-CM

## 2022-10-30 ENCOUNTER — Ambulatory Visit: Payer: Medicare Other

## 2022-11-04 DIAGNOSIS — Z23 Encounter for immunization: Secondary | ICD-10-CM | POA: Diagnosis not present

## 2022-11-07 ENCOUNTER — Telehealth: Payer: Self-pay | Admitting: Internal Medicine

## 2022-11-07 DIAGNOSIS — I7121 Aneurysm of the ascending aorta, without rupture: Secondary | ICD-10-CM

## 2022-11-07 DIAGNOSIS — I77810 Thoracic aortic ectasia: Secondary | ICD-10-CM

## 2022-11-07 NOTE — Telephone Encounter (Signed)
Sent message to scheduling to get echo appt made.

## 2022-11-07 NOTE — Telephone Encounter (Signed)
  Patient states that she usually has an echo every year and her last one was 04/02/21. Could orders be placed for her to have one this year?

## 2022-11-07 NOTE — Telephone Encounter (Signed)
Called home number. No answer, LVMTCB. Mobile number pt states not to leave vms on. Pt has not been seen by Dr. Jacques Navy since 2022 but has 1 year f/u scheduled for Dr. Jacques Navy at [facility] and Dr. Kirke Corin in [place]. Unsure if one of these appts need to be cancelled.

## 2022-11-10 NOTE — Telephone Encounter (Signed)
Confirmed which days work for her and Echo is to be scheduled per Dr. Jacques Navy. Updated message sent to scheduling pool.

## 2022-11-10 NOTE — Telephone Encounter (Signed)
Left message regarding echo appt. Letter sent to pt with appt details

## 2022-11-18 ENCOUNTER — Encounter: Payer: Self-pay | Admitting: Cardiovascular Disease

## 2022-11-19 NOTE — Telephone Encounter (Addendum)
Deanna Poisson, MD  You; Deanna Velasquez, RN1 hour ago (8:46 AM)    She is not scheduled for a stress echo and a stress echo is not indicated at this time. She is scheduled for a routine resting echo per her request prior to her visit with me and Dr. Kirke Corin. Please ensure patient is not scheduled for a stress echo and it has been communicated clearly to the patient. [place]     Spoke with pt regarding her upcoming echocardiogram. Resting complete echocardiogram was ordered and scheduled, however when instructions were mailed to pt, instructions for both a regular echo and stress echo were provided. Pt was very concerned about the possibility of having to be on a treadmill and elevating her blood pressure. Reassured pt that these instructions were added by mistake and that her echocardiogram would be done laying on her side and back. Pt expresses relief with this information. Confirmed date and time of echo and upcoming office visit with Dr. Kirke Corin. Pt verbalizes understanding.

## 2022-12-09 DIAGNOSIS — Z23 Encounter for immunization: Secondary | ICD-10-CM | POA: Diagnosis not present

## 2022-12-15 ENCOUNTER — Ambulatory Visit (HOSPITAL_COMMUNITY): Payer: Medicare Other | Attending: Cardiovascular Disease

## 2022-12-15 DIAGNOSIS — I77810 Thoracic aortic ectasia: Secondary | ICD-10-CM | POA: Diagnosis not present

## 2022-12-15 DIAGNOSIS — I7121 Aneurysm of the ascending aorta, without rupture: Secondary | ICD-10-CM | POA: Diagnosis not present

## 2022-12-15 LAB — ECHOCARDIOGRAM COMPLETE
Area-P 1/2: 3.05 cm2
P 1/2 time: 315 ms
S' Lateral: 2.7 cm

## 2022-12-30 ENCOUNTER — Ambulatory Visit: Payer: Medicare Other | Attending: Cardiovascular Disease | Admitting: Cardiovascular Disease

## 2022-12-30 ENCOUNTER — Encounter: Payer: Self-pay | Admitting: Cardiovascular Disease

## 2022-12-30 VITALS — BP 115/70 | HR 61 | Ht 67.0 in | Wt 129.0 lb

## 2022-12-30 DIAGNOSIS — I7121 Aneurysm of the ascending aorta, without rupture: Secondary | ICD-10-CM | POA: Diagnosis not present

## 2022-12-30 DIAGNOSIS — I773 Arterial fibromuscular dysplasia: Secondary | ICD-10-CM | POA: Insufficient documentation

## 2022-12-30 NOTE — Patient Instructions (Signed)
Medication Instructions:  No changes *If you need a refill on your cardiac medications before your next appointment, please call your pharmacy*   Lab Work: None ordered If you have labs (blood work) drawn today and your tests are completely normal, you will receive your results only by: MyChart Message (if you have MyChart) OR A paper copy in the mail If you have any lab test that is abnormal or we need to change your treatment, we will call you to review the results.   Testing/Procedures: None ordered   Follow-Up: At [facility], you and your health needs are our priority.  As part of our continuing mission to provide you with exceptional heart care, we have created designated Provider Care Teams.  These Care Teams include your primary Cardiologist (physician) and Advanced Practice Providers (APPs -  Physician Assistants and Nurse Practitioners) who all work together to provide you with the care you need, when you need it.  We recommend signing up for the patient portal called "MyChart".  Sign up information is provided on this After Visit Summary.  MyChart is used to connect with patients for Virtual Visits (Telemedicine).  Patients are able to view lab/test results, encounter notes, upcoming appointments, etc.  Non-urgent messages can be sent to your provider as well.   To learn more about what you can do with MyChart, go to [email or web address].    Your next appointment:   Follow up as needed  

## 2022-12-30 NOTE — Progress Notes (Signed)
Cardiology Office Note   Date:  12/30/2022   ID:  Deanna Velasquez, Deanna Velasquez Sep 12, 1950, MRN [id number]  PCP:  Deanna Dills, MD  Cardiologist:  Dr. Jacques Velasquez  No chief complaint on file.     History of Present Illness: Deanna Velasquez is a 72 y.o. adult who is here today for follow-up visit regarding fibromuscular dysplasia of the renal arteries.   She has known history of thoracic aortic aneurysm measuring 42 mm, GERD and irritable bowel syndrome. She is a previous smoker and quit in 05-Jan-1988.  Her mother died at the age of 15 of a ruptured aortic aneurysm.  She had a CTA of the aorta in January 04, 2022 which showed stable size ascending aortic aneurysm measuring 42 mm.  There was an incidental finding of fibromuscular dysplasia affecting the right renal artery. I requested carotid Doppler that showed minimal disease with no evidence of fibromuscular dysplasia.  The patient has no history of hypertension or chronic kidney disease.  She has no symptoms of chronic mesenteric ischemia.   Past Medical History:  Diagnosis Date   ADHD    Aortic aneurysm (HCC)    Cataracts, bilateral    Depression    GERD (gastroesophageal reflux disease)    IBS (irritable bowel syndrome)    No pertinent past medical history     Past Surgical History:  Procedure Laterality Date   BREAST EXCISIONAL BIOPSY Right    benign   BREAST LUMPECTOMY Right 01/04/02   benign   BREAST LUMPECTOMY Right Jan 05, 2012   BREAST LUMPECTOMY WITH NEEDLE LOCALIZATION  12/29/2011   Procedure: BREAST LUMPECTOMY WITH NEEDLE LOCALIZATION;  Surgeon: Deanna Paris, MD;  Location[facility];  Service: General;  Laterality: Right;  Needle Localized Removal Right Breast Mass   KNEE ARTHROSCOPY  06/1995   Dr. Fredric Velasquez   LAPAROSCOPY  04/1983   PILONIDAL CYST EXCISION  1969   Dr. Rolene Velasquez   TONSILECTOMY/ADENOIDECTOMY WITH MYRINGOTOMY  01-04-1954     Current Outpatient Medications  Medication Sig Dispense Refill   b complex vitamins  tablet Take 1 tablet by mouth daily.     buPROPion (WELLBUTRIN XL) 150 MG 24 hr tablet Take 150 mg by mouth daily.     Coenzyme Q10 (COQ10) 100 MG CAPS Take 1 capsule by mouth daily.      Cyanocobalamin (VITAMIN B-12) 3000 MCG SUBL Place 2-3 tablets under the tongue daily as needed.      oxybutynin (DITROPAN) 5 MG tablet Take 5 mg by mouth 2 (two) times daily.     No current facility-administered medications for this visit.    Allergies:   Paxil [paroxetine hcl], Prozac [fluoxetine hcl], Sertraline hcl, and Tape    Social History:  The patient  reports that Deanna Velasquez quit smoking about 42 years ago. Deanna Velasquez's smoking use included cigarettes. Deanna Velasquez has never used smokeless tobacco. Caitlynne reports current alcohol use. Deanna Velasquez reports that Deanna Velasquez does not use drugs.   Family History:  The patient's family history includes Breast cancer in Deanna Velasquez's maternal aunt and mother; Cancer in Deanna Velasquez's maternal aunt and mother; Congestive Heart Failure in Deanna Velasquez's sister and sister; Diabetes in Deanna Velasquez's sister; Stroke in Deanna Velasquez's father; Tuberculosis in Deanna Velasquez's maternal grandfather.    ROS:  Please see the history of present illness.   Otherwise, review of systems are positive for none.   All other systems are reviewed and negative.    PHYSICAL EXAM: VS:  BP 115/70 (BP Location: Left Arm, Patient Position: Sitting, Cuff Size: Normal)  Pulse 61   Ht 5\' 7"  (1.702 m)   Wt 129 lb (58.5 kg)   LMP  (LMP Unknown)   SpO2 97%   BMI 20.20 kg/m  , BMI Body mass index is 20.2 kg/m. GEN: Well nourished, well developed, in no acute distress  HEENT: normal  Neck: no JVD, carotid bruits, or masses Cardiac: RRR; no murmurs, rubs, or gallops,no edema  Respiratory:  clear to auscultation bilaterally, normal work of breathing GI: soft, nontender, nondistended, + BS MS: no deformity or atrophy  Skin: warm and dry, no rash Neuro:  Strength and sensation are intact Psych: euthymic mood, full  affect   EKG:  EKG is ordered today. The ekg ordered today demonstrates: Normal sinus rhythm Septal infarct , age undetermined No previous ECGs available    Recent Labs: 04/06/2022: BUN 5; Creatinine, Ser 0.78; Hemoglobin 12.9; Platelets 249; Potassium 3.9; Sodium 132    Lipid Panel    Component Value Date/Time   CHOL 178 04/09/2020 1434   TRIG 119 04/09/2020 1434   HDL 66 04/09/2020 1434   CHOLHDL 2.7 04/09/2020 1434   LDLCALC 91 04/09/2020 1434      Wt Readings from Last 3 Encounters:  12/30/22 129 lb (58.5 kg)  11/05/21 130 lb (59 kg)  04/03/20 134 lb (60.8 kg)         03/24/2018    9:31 AM  PAD Screen  Previous PAD dx? No  Previous surgical procedure? No  Pain with walking? No  Feet/toe relief with dangling? No  Painful, non-healing ulcers? No  Extremities discolored? No      ASSESSMENT AND PLAN:  1.  Fibromuscular dysplasia of the renal artery: The changes on the CT scan are overall mild with no evidence of obstructive physiology.  Her blood pressure continues to be normal and she does not seem to have any symptoms related to this.  In addition, there was no evidence of carotid involvement and she has no symptoms of chronic mesenteric ischemia.  2.  Ascending aortic aneurysm: This has been stable in size on most recent CT scan.  I agree with annual imaging.    Disposition:   FU with me as needed. Signed,  Deanna Bears, MD  12/30/2022 1:14 PM    [facility]

## 2023-02-11 ENCOUNTER — Other Ambulatory Visit: Payer: Self-pay | Admitting: Medical Genetics

## 2023-02-26 ENCOUNTER — Ambulatory Visit: Payer: Medicare Other | Attending: Internal Medicine | Admitting: Internal Medicine

## 2023-02-26 ENCOUNTER — Encounter: Payer: Self-pay | Admitting: Internal Medicine

## 2023-02-26 VITALS — BP 120/66 | HR 78 | Ht 67.0 in | Wt 129.0 lb

## 2023-02-26 DIAGNOSIS — I351 Nonrheumatic aortic (valve) insufficiency: Secondary | ICD-10-CM | POA: Insufficient documentation

## 2023-02-26 DIAGNOSIS — I773 Arterial fibromuscular dysplasia: Secondary | ICD-10-CM | POA: Insufficient documentation

## 2023-02-26 DIAGNOSIS — I77810 Thoracic aortic ectasia: Secondary | ICD-10-CM | POA: Diagnosis not present

## 2023-02-26 DIAGNOSIS — I7121 Aneurysm of the ascending aorta, without rupture: Secondary | ICD-10-CM

## 2023-02-26 NOTE — Patient Instructions (Signed)
Medication Instructions:  Your physician recommends that you continue on your current medications as directed. Please refer to the Current Medication list given to you today.  *If you need a refill on your cardiac medications before your next appointment, please call your pharmacy*  Lab Work: None  Testing/Procedures: Your physician has requested that you have an echocardiogram in about one year, right before you see Dr. Jacques Navy. Echocardiography is a painless test that uses sound waves to create images of your heart. It provides your doctor with information about the size and shape of your heart and how well your heart's chambers and valves are working. This procedure takes approximately one hour. There are no restrictions for this procedure. Please do NOT wear cologne, perfume, aftershave, or lotions (deodorant is allowed). Please arrive 15 minutes prior to your appointment time. This will take place at [address] [place]    Follow-Up: At [facility], you and your health needs are our priority.  As part of our continuing mission to provide you with exceptional heart care, we have created designated Provider Care Teams.  These Care Teams include your primary Cardiologist (physician) and Advanced Practice Providers (APPs -  Physician Assistants and Nurse Practitioners) who all work together to provide you with the care you need, when you need it.  Your next appointment:   12 month(s)  Provider:   Parke Poisson, MD

## 2023-02-26 NOTE — Progress Notes (Signed)
 Cardiology Office Note:  .   Date:  02/26/2023  ID:  Deanna Velasquez, DOB [birth year], MRN [id number] PCP: Renford Dills, MD  [facility] Providers Cardiologist:  Parke Poisson, MD    History of Present Illness: [name] Kitchen   Deanna Velasquez is a 73 y.o. adult.  Discussed the use of AI scribe software for clinical note transcription with the patient, who gave verbal consent to proceed.  History of Present Illness   The patient is a 73 year old female with aortic dilation and fibromuscular dysplasia who presents for a cardiology follow-up.  She has aortic dilation that has been stable over the past few years, with the most recent measurements being 42 mm on CT and 41 mm on echocardiogram, consistent with previous measurements.  She has a history of fibromuscular dysplasia, which has been stable and not contributing to any blood pressure issues. She understands that this condition can cause high blood pressure or vessel dissections.  Stable AI. This condition requires monitoring through echocardiograms every one to two years unless symptoms change.  She is cautious about her physical activities, avoiding lifting over 30 pounds to prevent straining. She describes a method of handling heavy feed bags by splitting them into smaller portions to manage them safely.  She quit smoking in 1972 and is proud of this achievement, noting a discrepancy in her medical records that incorrectly stated she quit in 1989.  She has no family, as her half-sister passed away a year and a half ago. She had reconnected five years prior and developed a close relationship, often having long phone conversations.        ROS: negative except per HPI above.  Studies Reviewed: .        Results   RADIOLOGY CT scan: Aorta measured at 42 mm  DIAGNOSTIC Echocardiogram: Aorta measured at 41 mm; mild to moderate aortic valve regurgitation EKG: Normal     Risk Assessment/Calculations:    Physical Exam:   VS:   BP 120/66   Pulse 78   Ht 5\' 7"  (1.702 m)   Wt 129 lb (58.5 kg)   LMP  (LMP Unknown)   SpO2 98%   BMI 20.20 kg/m    Wt Readings from Last 3 Encounters:  02/26/23 129 lb (58.5 kg)  12/30/22 129 lb (58.5 kg)  11/05/21 130 lb (59 kg)     Physical Exam   CHEST: Lung sounds clear on auscultation. CARDIOVASCULAR: Faint diastolic murmur    GEN: Well nourished, well developed in no acute distress NECK: No JVD; No carotid bruits CARDIAC: RRR RESPIRATORY:  Clear to auscultation without rales, wheezing or rhonchi  ABDOMEN: Soft, non-tender, non-distended EXTREMITIES:  No edema; No deformity   ASSESSMENT AND PLAN: .    Assessment & Plan Ascending aorta dilatation (HCC)  Fibromuscular dysplasia (HCC)  Nonrheumatic aortic valve insufficiency   Assessment and Plan    Aortic Dilation Stable dilation of the aorta at 42mm, not technically meeting criteria for aneurysm. No change in size over the past three years. Patient is considering consultation with a surgeon for potential future intervention, but current stability suggests no immediate need for surgical intervention. -Continue monitoring with annual echocardiograms. Next echo scheduled prior to next appointment in one year.  Mild to Moderate Aortic Valve Regurgitation Stable, no change in symptoms. -Continue monitoring with annual or biennial echocardiograms.  General Health Maintenance Patient is mindful of maintaining good health, including careful management of blood pressure and avoiding heavy lifting. -Continue current  health maintenance practices.  Follow-up Plans -Next appointment in one year with an echocardiogram scheduled prior to the visit. -MyChart message to be sent with appointment details.                I spent 30 minutes in the care of Deanna Velasquez today including reviewing studies (Ct and echo noted above), face to face time discussing treatment options (22 min), and documenting in the encounter.

## 2023-03-26 DIAGNOSIS — L821 Other seborrheic keratosis: Secondary | ICD-10-CM | POA: Diagnosis not present

## 2023-03-26 DIAGNOSIS — D1801 Hemangioma of skin and subcutaneous tissue: Secondary | ICD-10-CM | POA: Diagnosis not present

## 2023-03-26 DIAGNOSIS — D225 Melanocytic nevi of trunk: Secondary | ICD-10-CM | POA: Diagnosis not present

## 2023-03-26 DIAGNOSIS — B0089 Other herpesviral infection: Secondary | ICD-10-CM | POA: Diagnosis not present

## 2023-04-27 DIAGNOSIS — F324 Major depressive disorder, single episode, in partial remission: Secondary | ICD-10-CM | POA: Diagnosis not present

## 2023-04-27 DIAGNOSIS — E7800 Pure hypercholesterolemia, unspecified: Secondary | ICD-10-CM | POA: Diagnosis not present

## 2023-04-28 ENCOUNTER — Ambulatory Visit
Admission: RE | Admit: 2023-04-28 | Discharge: 2023-04-28 | Disposition: A | Discharge status: Home / Self Care | Payer: Medicare Other | Source: Ambulatory Visit | Attending: Internal Medicine | Admitting: Internal Medicine

## 2023-04-28 DIAGNOSIS — E2839 Other primary ovarian failure: Secondary | ICD-10-CM | POA: Diagnosis not present

## 2023-04-28 DIAGNOSIS — M8588 Other specified disorders of bone density and structure, other site: Secondary | ICD-10-CM | POA: Diagnosis not present

## 2023-04-28 DIAGNOSIS — M8580 Other specified disorders of bone density and structure, unspecified site: Secondary | ICD-10-CM

## 2023-04-28 DIAGNOSIS — N958 Other specified menopausal and perimenopausal disorders: Secondary | ICD-10-CM | POA: Diagnosis not present

## 2023-05-05 DIAGNOSIS — S61019A Laceration without foreign body of unspecified thumb without damage to nail, initial encounter: Secondary | ICD-10-CM | POA: Diagnosis not present

## 2023-05-05 DIAGNOSIS — Z23 Encounter for immunization: Secondary | ICD-10-CM | POA: Diagnosis not present

## 2023-05-05 DIAGNOSIS — W5501XA Bitten by cat, initial encounter: Secondary | ICD-10-CM | POA: Diagnosis not present

## 2023-05-27 DIAGNOSIS — E7800 Pure hypercholesterolemia, unspecified: Secondary | ICD-10-CM | POA: Diagnosis not present

## 2023-05-27 DIAGNOSIS — F324 Major depressive disorder, single episode, in partial remission: Secondary | ICD-10-CM | POA: Diagnosis not present

## 2023-07-27 ENCOUNTER — Other Ambulatory Visit: Payer: Self-pay | Admitting: Internal Medicine

## 2023-07-27 DIAGNOSIS — Z1231 Encounter for screening mammogram for malignant neoplasm of breast: Secondary | ICD-10-CM

## 2023-08-24 DIAGNOSIS — J0100 Acute maxillary sinusitis, unspecified: Secondary | ICD-10-CM | POA: Diagnosis not present

## 2023-08-25 ENCOUNTER — Ambulatory Visit
Admission: RE | Admit: 2023-08-25 | Discharge: 2023-08-25 | Disposition: A | Discharge status: Home / Self Care | Source: Ambulatory Visit

## 2023-08-25 DIAGNOSIS — Z1231 Encounter for screening mammogram for malignant neoplasm of breast: Secondary | ICD-10-CM | POA: Diagnosis not present

## 2023-08-31 ENCOUNTER — Ambulatory Visit (INDEPENDENT_AMBULATORY_CARE_PROVIDER_SITE_OTHER): Admitting: Nurse Practitioner

## 2023-08-31 ENCOUNTER — Encounter: Payer: Self-pay | Admitting: Nurse Practitioner

## 2023-08-31 VITALS — BP 110/82 | HR 66 | Temp 97.4°F | Resp 11 | Ht 67.8 in | Wt 127.6 lb

## 2023-08-31 DIAGNOSIS — F331 Major depressive disorder, recurrent, moderate: Secondary | ICD-10-CM | POA: Diagnosis not present

## 2023-08-31 DIAGNOSIS — M8580 Other specified disorders of bone density and structure, unspecified site: Secondary | ICD-10-CM | POA: Diagnosis not present

## 2023-08-31 DIAGNOSIS — I351 Nonrheumatic aortic (valve) insufficiency: Secondary | ICD-10-CM | POA: Insufficient documentation

## 2023-08-31 DIAGNOSIS — I77810 Thoracic aortic ectasia: Secondary | ICD-10-CM | POA: Insufficient documentation

## 2023-08-31 DIAGNOSIS — N3281 Overactive bladder: Secondary | ICD-10-CM | POA: Insufficient documentation

## 2023-08-31 LAB — COMPREHENSIVE METABOLIC PANEL WITH GFR
AG Ratio: 1.6 (calc) (ref 1.0–2.5)
ALT: 12 U/L (ref 6–29)
AST: 19 U/L (ref 10–35)
Albumin: 4.1 g/dL (ref 3.6–5.1)
Alkaline phosphatase (APISO): 61 U/L (ref 37–153)
BUN: 12 mg/dL (ref 7–25)
CO2: 29 mmol/L (ref 20–32)
Calcium: 9.4 mg/dL (ref 8.6–10.4)
Chloride: 102 mmol/L (ref 98–110)
Creat: 0.81 mg/dL (ref 0.60–1.00)
Globulin: 2.6 g/dL (ref 1.9–3.7)
Glucose, Bld: 85 mg/dL (ref 65–99)
Potassium: 4.2 mmol/L (ref 3.5–5.3)
Sodium: 138 mmol/L (ref 135–146)
Total Bilirubin: 0.3 mg/dL (ref 0.2–1.2)
Total Protein: 6.7 g/dL (ref 6.1–8.1)
eGFR: 76 mL/min/1.73m2 (ref >=60)

## 2023-08-31 LAB — CBC WITH DIFFERENTIAL/PLATELET
Absolute Lymphocytes: 1444 {cells}/uL (ref 850–3900)
Absolute Monocytes: 706 {cells}/uL (ref 200–950)
Basophils Absolute: 58 {cells}/uL (ref 0–200)
Basophils Relative: 0.7 %
Eosinophils Absolute: 108 {cells}/uL (ref 15–500)
Eosinophils Relative: 1.3 %
HCT: 40.1 % (ref 35.0–45.0)
Hemoglobin: 13 g/dL (ref 11.7–15.5)
MCH: 30.4 pg (ref 27.0–33.0)
MCHC: 32.4 g/dL (ref 32.0–36.0)
MCV: 93.9 fL (ref 80.0–100.0)
MPV: 9.4 fL (ref 7.5–12.5)
Monocytes Relative: 8.5 %
Neutro Abs: 5984 {cells}/uL (ref 1500–7800)
Neutrophils Relative %: 72.1 %
Platelets: 386 Thousand/uL (ref 140–400)
RBC: 4.27 Million/uL (ref 3.80–5.10)
RDW: 12 % (ref 11.0–15.0)
Total Lymphocyte: 17.4 %
WBC: 8.3 Thousand/uL (ref 3.8–10.8)

## 2023-08-31 NOTE — Assessment & Plan Note (Signed)
Recommended to take calcium 600 mg twice daily with Vitamin D 2000 units daily and weight bearing activity 30 mins/5 days a week   

## 2023-08-31 NOTE — Progress Notes (Unsigned)
 Careteam: Patient Care Team: Caro Harlene POUR, NP as PCP - General (Geriatric Medicine) Loni Soyla LABOR, MD as PCP - Cardiology (Cardiology) Lavoie, Marie-Lyne, MD as Obstetrician (Obstetrics and Gynecology)  PLACE OF SERVICE:  [facility]  Advanced Directive information    Allergies  Allergen Reactions   Paxil [Paroxetine Hcl]     ineffective   Prozac [Fluoxetine Hcl]     Irritation/anger   Sertraline Hcl     Hyperactive/compulsive   Tape     Chief Complaint  Patient presents with   Establish Care    New Patient    HPI:  Discussed the use of AI scribe software for clinical note transcription with the patient, who gave verbal consent to proceed.  History of Present Illness Deanna Velasquez is a 73 year old who presents for an established care appointment.  They recently experienced a sinus infection associated with COVID-19 and were prescribed Augmentin  by a physician assistant a week ago. They are currently taking Augmentin  and expect to finish the course in three days.  They have a history of depression and are currently taking bupropion 150 mg XR, with a prescription for 300 mg daily. They prefer to take 150 mg twice a day for flexibility. They describe their depression as sometimes moderate and are managing it themselves. No thoughts of self-harm or harm to others.  They have a history of cataracts, with one cataract removed, and a history of IBS, which is not currently active. They participated in a Lin[employer] study for IBS. They also have a history of a lumpectomy in 2013, which was benign. Mother had breast cancer.   They are taking oxybutynin 5 mg for overactive bladder, usually once a day, and report dry mouth as a side effect. No current constipation issues but have had them in the past.  They take a B12 supplement, likely 2000 mcg, twice a day, and a multivitamin. They also take Viactiv for calcium, which includes vitamin D and K.  They have a family  history of breast cancer in their mother, who passed from a ruptured aortic aneurysm, and a sister who died of metastatic breast cancer. Another sister had congestive heart failure and diabetes.  They quit smoking in 1982 at age 61, having started in 14. They occasionally consume coffee liqueur, about two drinks a week, and deny recreational drug use.  They have a history of aortic valve regurgitation and an aortic aneurysm near the heart, for which they are followed by a cardiologist. No history of high cholesterol or diabetes.   Review of Systems:  Review of Systems  Constitutional:  Negative for chills, fever and weight loss.  HENT:  Negative for tinnitus.   Respiratory:  Negative for cough, sputum production and shortness of breath.   Cardiovascular:  Negative for chest pain, palpitations and leg swelling.  Gastrointestinal:  Negative for abdominal pain, constipation, diarrhea and heartburn.  Genitourinary:  Negative for dysuria, frequency and urgency.  Musculoskeletal:  Negative for back pain, falls, joint pain and myalgias.  Skin: Negative.   Neurological:  Negative for dizziness and headaches.  Psychiatric/Behavioral:  Negative for depression and memory loss. The patient does not have insomnia.    Past Medical History:  Diagnosis Date   ADHD    Aortic aneurysm (HCC)    Cataracts, bilateral    Depression    GERD (gastroesophageal reflux disease) 1982   Not a current problem   IBS (irritable bowel syndrome)    No pertinent past  medical history    Past Surgical History:  Procedure Laterality Date   BREAST EXCISIONAL BIOPSY Right    benign   BREAST LUMPECTOMY WITH NEEDLE LOCALIZATION  12/29/2011   Procedure: BREAST LUMPECTOMY WITH NEEDLE LOCALIZATION;  Surgeon: Sherlean JINNY Laughter, MD;  Location: [facility];  Service: General;  Laterality: Right;  Needle Localized Removal Right Breast Mass   BREAST SURGERY  2013   Lumpectomy - Benign   EYE SURGERY  2023    Cataract - Rt Eye   KNEE ARTHROSCOPY  06/1995   Dr. Josephina   LAPAROSCOPY  04/1983   PILONIDAL CYST EXCISION  1969   Dr. Jannetta   TONSILECTOMY/ADENOIDECTOMY WITH MYRINGOTOMY  12/1953   Social History:   reports that Deanna Velasquez quit smoking about 43 years ago. Deanna Velasquez's smoking use included cigarettes. Deanna Velasquez has never used smokeless tobacco. Deanna Velasquez reports current alcohol use. Deanna Velasquez reports that Deanna Velasquez does not use drugs.  Family History  Problem Relation Age of Onset   Cancer Mother        breast   Breast cancer Mother    COPD Mother    Depression Mother    Hypertension Mother    Stroke Mother    Varicose Veins Mother    Stroke Father    Cancer Maternal Aunt        breast   Breast cancer Maternal Aunt    Congestive Heart Failure Sister    Diabetes Sister    Tuberculosis Maternal Grandfather    Congestive Heart Failure Sister     Medications: Patient's Medications  New Prescriptions   No medications on file  Previous Medications   AMOXICILLIN -CLAVULANATE (AUGMENTIN ) 875-125 MG TABLET    Take 1 tablet by mouth 2 (two) times daily.   B COMPLEX VITAMINS TABLET    Take 1 tablet by mouth daily.   BUPROPION (WELLBUTRIN XL) 150 MG 24 HR TABLET    Take 150 mg by mouth daily.   COENZYME Q10 (COQ10) 100 MG CAPS    Take 1 capsule by mouth daily.    CYANOCOBALAMIN  (VITAMIN B-12) 3000 MCG SUBL    Place 2-3 tablets under the tongue daily as needed.    MULTIPLE VITAMINS-MINERALS (CENTRUM SILVER 50+WOMEN PO)       OXYBUTYNIN (DITROPAN) 5 MG TABLET    Take 5 mg by mouth 2 (two) times daily.  Modified Medications   No medications on file  Discontinued Medications   No medications on file    Physical Exam:  Vitals:   08/31/23 1424  BP: 110/82  Pulse: 66  Resp: 11  Temp: (!) 97.4 F (36.3 C)  SpO2: 98%  Weight: 127 lb 9.6 oz (57.9 kg)  Height: 5' 7.8 (1.722 m)   Body mass index is 19.52 kg/m. Wt Readings from Last 3 Encounters:  08/31/23 127 lb 9.6 oz (57.9 kg)   02/26/23 129 lb (58.5 kg)  12/30/22 129 lb (58.5 kg)    Physical Exam Constitutional:      General: Deanna Velasquez is not in acute distress.    Appearance: Deanna Velasquez is well-developed. Deanna Velasquez is not diaphoretic.  HENT:     Head: Normocephalic and atraumatic.     Mouth/Throat:     Pharynx: No oropharyngeal exudate.  Eyes:     Conjunctiva/sclera: Conjunctivae normal.     Pupils: Pupils are equal, round, and reactive to light.  Cardiovascular:     Rate and Rhythm: Normal rate and regular rhythm.     Heart sounds: Normal heart sounds.  Pulmonary:  Effort: Pulmonary effort is normal.     Breath sounds: Normal breath sounds.  Abdominal:     General: Bowel sounds are normal.     Palpations: Abdomen is soft.  Musculoskeletal:     Cervical back: Normal range of motion and neck supple.     Right lower leg: No edema.     Left lower leg: No edema.  Skin:    General: Skin is warm and dry.  Neurological:     Mental Status: Deanna Velasquez is alert.  Psychiatric:        Mood and Affect: Mood normal.     Labs reviewed: Basic Metabolic Panel: No results for input(s): NA, K, CL, CO2, GLUCOSE, BUN, CREATININE, CALCIUM, MG, PHOS, TSH in the last 8760 hours. Liver Function Tests: No results for input(s): AST, ALT, ALKPHOS, BILITOT, PROT, ALBUMIN in the last 8760 hours. No results for input(s): LIPASE, AMYLASE in the last 8760 hours. No results for input(s): AMMONIA in the last 8760 hours. CBC: No results for input(s): WBC, NEUTROABS, HGB, HCT, MCV, PLT in the last 8760 hours. Lipid Panel: No results for input(s): CHOL, HDL, LDLCALC, TRIG, CHOLHDL, LDLDIRECT in the last 8760 hours. TSH: No results for input(s): TSH in the last 8760 hours. A1C: No results found for: HGBA1C   Assessment/Plan Overactive bladder Assessment & Plan: Well controlled on oxybutynin. Minimal side effects noted.    Moderate episode of recurrent major  depressive disorder (HCC) Assessment & Plan: Well controlled on Wellbutrin 150 mg - takes 2 tablets daily.     Ascending aorta dilatation (HCC) Assessment & Plan: Being monitor closely by cardiology    Nonrheumatic aortic valve insufficiency Assessment & Plan: Stable without shortness of breath or chest pains.   Orders: -     CBC with Differential/Platelet -     Comprehensive metabolic panel with GFR  Osteopenia, unspecified location Assessment & Plan: Recommended to take calcium 600 mg twice daily with Vitamin D 2000 units daily and weight bearing activity 30 mins/5 days a week     Return in about 6 months (around 03/02/2024) for routine follow up. Deanna Velasquez [facility] [phone]

## 2023-08-31 NOTE — Patient Instructions (Addendum)
 Call back to schedule AWV- make sure it is after 09/14/2023  Make sure to sign record release for previous PCP  Recommended to take calcium 600 mg twice daily with Vitamin D 2000 units daily and weight bearing activity 30 mins/5 days a week

## 2023-08-31 NOTE — Assessment & Plan Note (Signed)
 Being monitor closely by cardiology

## 2023-08-31 NOTE — Assessment & Plan Note (Signed)
 Stable without shortness of breath or chest pains.

## 2023-08-31 NOTE — Assessment & Plan Note (Signed)
 Well controlled on Wellbutrin 150 mg - takes 2 tablets daily.

## 2023-08-31 NOTE — Assessment & Plan Note (Signed)
 Well controlled on oxybutynin. Minimal side effects noted.

## 2023-09-01 ENCOUNTER — Ambulatory Visit: Payer: Self-pay | Admitting: Nurse Practitioner

## 2023-09-17 DIAGNOSIS — Z23 Encounter for immunization: Secondary | ICD-10-CM | POA: Diagnosis not present

## 2023-09-17 DIAGNOSIS — F324 Major depressive disorder, single episode, in partial remission: Secondary | ICD-10-CM | POA: Diagnosis not present

## 2023-09-17 DIAGNOSIS — M8580 Other specified disorders of bone density and structure, unspecified site: Secondary | ICD-10-CM | POA: Diagnosis not present

## 2023-09-17 DIAGNOSIS — I77810 Thoracic aortic ectasia: Secondary | ICD-10-CM | POA: Diagnosis not present

## 2023-09-17 DIAGNOSIS — E7800 Pure hypercholesterolemia, unspecified: Secondary | ICD-10-CM | POA: Diagnosis not present

## 2023-09-17 DIAGNOSIS — Z5181 Encounter for therapeutic drug level monitoring: Secondary | ICD-10-CM | POA: Diagnosis not present

## 2023-09-17 DIAGNOSIS — Z0000 Encounter for general adult medical examination without abnormal findings: Secondary | ICD-10-CM | POA: Diagnosis not present

## 2023-09-21 ENCOUNTER — Encounter: Payer: Self-pay | Admitting: Nurse Practitioner

## 2023-10-28 DIAGNOSIS — N952 Postmenopausal atrophic vaginitis: Secondary | ICD-10-CM | POA: Diagnosis not present

## 2023-12-08 ENCOUNTER — Other Ambulatory Visit

## 2023-12-18 DIAGNOSIS — Z23 Encounter for immunization: Secondary | ICD-10-CM | POA: Diagnosis not present

## 2023-12-31 DIAGNOSIS — Z23 Encounter for immunization: Secondary | ICD-10-CM | POA: Diagnosis not present

## 2024-02-19 ENCOUNTER — Ambulatory Visit (HOSPITAL_COMMUNITY)
Admission: RE | Admit: 2024-02-19 | Discharge: 2024-02-19 | Disposition: A | Source: Ambulatory Visit | Attending: Internal Medicine

## 2024-02-19 DIAGNOSIS — I351 Nonrheumatic aortic (valve) insufficiency: Secondary | ICD-10-CM | POA: Insufficient documentation

## 2024-02-19 DIAGNOSIS — I7121 Aneurysm of the ascending aorta, without rupture: Secondary | ICD-10-CM | POA: Insufficient documentation

## 2024-02-19 LAB — ECHOCARDIOGRAM COMPLETE
Area-P 1/2: 2.32 cm2
P 1/2 time: 457 ms
S' Lateral: 2.9 cm

## 2024-02-21 ENCOUNTER — Ambulatory Visit: Payer: Self-pay | Admitting: Internal Medicine

## 2024-03-04 ENCOUNTER — Ambulatory Visit: Admitting: Nurse Practitioner

## 2024-03-15 ENCOUNTER — Ambulatory Visit: Admitting: Internal Medicine
